# Patient Record
Sex: Female | Born: 1960 | Race: White | Hispanic: No | Marital: Married | Smoking: Former smoker
Health system: Southern US, Community
[De-identification: ages and names within clinical notes are randomized; demographics above are authoritative.]

## PROBLEM LIST (undated history)

## (undated) DIAGNOSIS — J449 Chronic obstructive pulmonary disease, unspecified: Secondary | ICD-10-CM

## (undated) DIAGNOSIS — J45909 Unspecified asthma, uncomplicated: Secondary | ICD-10-CM

## (undated) DIAGNOSIS — F329 Major depressive disorder, single episode, unspecified: Secondary | ICD-10-CM

## (undated) DIAGNOSIS — F419 Anxiety disorder, unspecified: Secondary | ICD-10-CM

## (undated) DIAGNOSIS — R0600 Dyspnea, unspecified: Secondary | ICD-10-CM

## (undated) DIAGNOSIS — E039 Hypothyroidism, unspecified: Secondary | ICD-10-CM

## (undated) DIAGNOSIS — F32A Depression, unspecified: Secondary | ICD-10-CM

## (undated) DIAGNOSIS — K219 Gastro-esophageal reflux disease without esophagitis: Secondary | ICD-10-CM

## (undated) DIAGNOSIS — M797 Fibromyalgia: Secondary | ICD-10-CM

## (undated) DIAGNOSIS — E669 Obesity, unspecified: Secondary | ICD-10-CM

## (undated) DIAGNOSIS — R918 Other nonspecific abnormal finding of lung field: Secondary | ICD-10-CM

## (undated) DIAGNOSIS — E785 Hyperlipidemia, unspecified: Secondary | ICD-10-CM

## (undated) HISTORY — DX: Other nonspecific abnormal finding of lung field: R91.8

## (undated) HISTORY — DX: Unspecified asthma, uncomplicated: J45.909

## (undated) HISTORY — DX: Fibromyalgia: M79.7

## (undated) HISTORY — DX: Depression, unspecified: F32.A

## (undated) HISTORY — DX: Hypothyroidism, unspecified: E03.9

## (undated) HISTORY — DX: Dyspnea, unspecified: R06.00

## (undated) HISTORY — DX: Gastro-esophageal reflux disease without esophagitis: K21.9

## (undated) HISTORY — DX: Anxiety disorder, unspecified: F41.9

## (undated) HISTORY — DX: Obesity, unspecified: E66.9

## (undated) HISTORY — DX: Hyperlipidemia, unspecified: E78.5

## (undated) HISTORY — DX: Major depressive disorder, single episode, unspecified: F32.9

## (undated) HISTORY — PX: GASTRIC BYPASS: SHX52

---

## 1989-09-07 HISTORY — PX: TUBAL LIGATION: SHX77

## 2011-05-09 LAB — HM MAMMOGRAPHY

## 2011-10-09 LAB — HM PAP SMEAR

## 2012-12-13 ENCOUNTER — Ambulatory Visit (INDEPENDENT_AMBULATORY_CARE_PROVIDER_SITE_OTHER): Payer: Medicare Other | Admitting: Family Medicine

## 2012-12-13 ENCOUNTER — Encounter: Payer: Self-pay | Admitting: Family Medicine

## 2012-12-13 VITALS — BP 130/89 | HR 74 | Ht 64.0 in | Wt 198.0 lb

## 2012-12-13 DIAGNOSIS — Z5181 Encounter for therapeutic drug level monitoring: Secondary | ICD-10-CM

## 2012-12-13 DIAGNOSIS — F329 Major depressive disorder, single episode, unspecified: Secondary | ICD-10-CM

## 2012-12-13 DIAGNOSIS — M79609 Pain in unspecified limb: Secondary | ICD-10-CM

## 2012-12-13 DIAGNOSIS — E039 Hypothyroidism, unspecified: Secondary | ICD-10-CM

## 2012-12-13 DIAGNOSIS — E559 Vitamin D deficiency, unspecified: Secondary | ICD-10-CM

## 2012-12-13 DIAGNOSIS — E785 Hyperlipidemia, unspecified: Secondary | ICD-10-CM

## 2012-12-13 DIAGNOSIS — F3289 Other specified depressive episodes: Secondary | ICD-10-CM

## 2012-12-13 DIAGNOSIS — F32A Depression, unspecified: Secondary | ICD-10-CM

## 2012-12-13 MED ORDER — NORTRIPTYLINE HCL 25 MG PO CAPS: 25.0000 mg | ORAL_CAPSULE | Freq: Every day | ORAL | Status: DC

## 2012-12-13 MED ORDER — DULOXETINE HCL 30 MG PO CPEP: ORAL_CAPSULE | ORAL | Status: DC

## 2012-12-13 MED ORDER — LEVOTHYROXINE SODIUM 125 MCG PO TABS: 125.0000 ug | ORAL_TABLET | Freq: Every day | ORAL | Status: DC

## 2012-12-13 MED ORDER — SIMVASTATIN 40 MG PO TABS: 40.0000 mg | ORAL_TABLET | Freq: Every evening | ORAL | Status: DC

## 2012-12-13 MED ORDER — ERGOCALCIFEROL 1.25 MG (50000 UT) PO CAPS: ORAL_CAPSULE | ORAL | Status: DC

## 2012-12-13 NOTE — Progress Notes (Signed)
Subjective:     Patient ID: Brandy Hood, female   DOB: Feb 08, 1961, 52 y.o.   MRN: [id number]  HPI Brandy Hood is here today to get some of her medications refilled.  She has done well since her last office visit.   Review of Systems  Constitutional: Negative for unexpected weight change.  Respiratory: Negative for shortness of breath.   Cardiovascular: Negative for chest pain.       Objective:   Physical Exam  Constitutional: She appears well-nourished. No distress.  Cardiovascular: Normal rate, regular rhythm and normal heart sounds.   Pulmonary/Chest: Effort normal and breath sounds normal.  Musculoskeletal: She exhibits no edema.       Assessment:   Hyperlipidemia Hypothyroidism  Fibromyalgia Vitamin D Defiency     Plan:    She was given refills for her medications and is to return in June for a recheck of her labs.

## 2012-12-14 ENCOUNTER — Other Ambulatory Visit: Payer: Medicare Other

## 2012-12-17 DIAGNOSIS — F329 Major depressive disorder, single episode, unspecified: Secondary | ICD-10-CM | POA: Insufficient documentation

## 2012-12-17 DIAGNOSIS — E785 Hyperlipidemia, unspecified: Secondary | ICD-10-CM | POA: Insufficient documentation

## 2012-12-17 DIAGNOSIS — M79609 Pain in unspecified limb: Secondary | ICD-10-CM | POA: Insufficient documentation

## 2012-12-17 DIAGNOSIS — E039 Hypothyroidism, unspecified: Secondary | ICD-10-CM | POA: Insufficient documentation

## 2012-12-17 DIAGNOSIS — E559 Vitamin D deficiency, unspecified: Secondary | ICD-10-CM | POA: Insufficient documentation

## 2012-12-17 DIAGNOSIS — F32A Depression, unspecified: Secondary | ICD-10-CM | POA: Insufficient documentation

## 2013-01-25 ENCOUNTER — Encounter: Payer: Self-pay | Admitting: Family Medicine

## 2013-01-25 ENCOUNTER — Ambulatory Visit (INDEPENDENT_AMBULATORY_CARE_PROVIDER_SITE_OTHER): Payer: Medicare Other | Admitting: Family Medicine

## 2013-01-25 ENCOUNTER — Ambulatory Visit (HOSPITAL_BASED_OUTPATIENT_CLINIC_OR_DEPARTMENT_OTHER)
Admission: RE | Admit: 2013-01-25 | Discharge: 2013-01-25 | Disposition: A | Discharge status: Home / Self Care | Payer: Medicare Other | Source: Ambulatory Visit | Attending: Family Medicine | Admitting: Family Medicine

## 2013-01-25 VITALS — BP 113/78 | HR 79 | Wt 200.0 lb

## 2013-01-25 DIAGNOSIS — J45909 Unspecified asthma, uncomplicated: Secondary | ICD-10-CM | POA: Insufficient documentation

## 2013-01-25 DIAGNOSIS — Z9884 Bariatric surgery status: Secondary | ICD-10-CM | POA: Insufficient documentation

## 2013-01-25 DIAGNOSIS — IMO0001 Reserved for inherently not codable concepts without codable children: Secondary | ICD-10-CM | POA: Insufficient documentation

## 2013-01-25 DIAGNOSIS — M549 Dorsalgia, unspecified: Secondary | ICD-10-CM

## 2013-01-25 DIAGNOSIS — M546 Pain in thoracic spine: Secondary | ICD-10-CM | POA: Insufficient documentation

## 2013-01-25 MED ORDER — TIZANIDINE HCL 4 MG PO TABS: 4.0000 mg | ORAL_TABLET | Freq: Three times a day (TID) | ORAL | Status: DC

## 2013-01-25 MED ORDER — METHYLPREDNISOLONE SODIUM SUCC 125 MG IJ SOLR
125.0000 mg | Freq: Once | INTRAMUSCULAR | Status: AC
  Administered 2013-01-25: 125 mg via INTRAMUSCULAR

## 2013-01-25 MED ORDER — NABUMETONE 750 MG PO TABS: 750.0000 mg | ORAL_TABLET | Freq: Two times a day (BID) | ORAL | Status: AC

## 2013-01-25 MED ORDER — KETOROLAC TROMETHAMINE 60 MG/2ML IM SOLN
60.0000 mg | Freq: Once | INTRAMUSCULAR | Status: AC
  Administered 2013-01-25: 60 mg via INTRAMUSCULAR

## 2013-01-25 NOTE — Patient Instructions (Addendum)
Thoracic Strain  You have injured the muscles or tendons that attach to the upper part of your back behind your chest. This injury is called a thoracic strain, thoracic sprain, or mid-back strain.   CAUSES   The cause of thoracic strain varies. A less severe injury involves pulling a muscle or tendon without tearing it. A more severe injury involves tearing (rupturing) a muscle or tendon. With less severe injuries, there may be little loss of strength. Sometimes, there are breaks (fractures) in the bones to which the muscles are attached. These fractures are rare, unless there was a direct hit (trauma) or you have weak bones due to osteoporosis or age. Longstanding strains may be caused by overuse or improper form during certain movements. Obesity can also increase your risk for back injuries. Sudden strains may occur due to injury or not warming up properly before exercise. Often, there is no obvious cause for a thoracic strain.  SYMPTOMS   The main symptom is pain, especially with movement, such as during exercise.  DIAGNOSIS   Your caregiver can usually tell what is wrong by taking an X-ray and doing a physical exam.  TREATMENT    Physical therapy may be helpful for recovery. Your caregiver can give you exercises to do or refer you to a physical therapist after your pain improves.   After your pain improves, strengthening and conditioning programs appropriate for your sport or occupation may be helpful.   Always warm up before physical activities or athletics. Stretching after physical activity may also help.   Certain over-the-counter medicines may also help. Ask your caregiver if there are medicines that would help you.  If this is your first thoracic strain injury, proper care and proper healing time before starting activities should prevent long-term problems. Torn ligaments and tendons require as long to heal as broken bones. Average healing times may be only 1 week for a mild strain. For torn muscles  and tendons, healing time may be up to 6 weeks to 2 months.  HOME CARE INSTRUCTIONS    Apply ice to the injured area. Ice massages may also be used as directed.   Put ice in a plastic bag.   Place a towel between your skin and the bag.   Leave the ice on for 15-20 minutes, 3-4 times a day, for the first 2 days.   Only take over-the-counter or prescription medicines for pain, discomfort, or fever as directed by your caregiver.   Keep your appointments for physical therapy if this was prescribed.   Use wraps and back braces as instructed.  SEEK IMMEDIATE MEDICAL CARE IF:    You have an increase in bruising, swelling, or pain.   Your pain has not improved with medicines.   You develop new shortness of breath, chest pain, or fever.   Problems seem to be getting worse rather than better.  MAKE SURE YOU:    Understand these instructions.   Will watch your condition.   Will get help right away if you are not doing well or get worse.  Document Released: 11/14/2003 Document Revised: 11/16/2011 Document Reviewed: 10/10/2010  [employer] Patient Information 2014 [employer].

## 2013-01-25 NOTE — Progress Notes (Signed)
  Subjective:    Patient ID: Brandy Hood, female    DOB: 1961-05-28, 52 y.o.   MRN: [id number] Brandy Hood is here today with her husband Brandy Hood) complaining of mid-back pain.    Back Pain This is a new problem. The current episode started yesterday. The problem occurs constantly. The problem has been gradually worsening since onset. The pain is present in the thoracic spine. The quality of the pain is described as stabbing and aching. The pain does not radiate. The pain is at a severity of 9/10. The pain is severe. The pain is the same all the time. The symptoms are aggravated by bending, coughing, standing and twisting. Stiffness is present all day. Pertinent negatives include no numbness, paresthesias or weakness. Risk factors include obesity, history of steroid use and menopause. She has tried analgesics and NSAIDs for the symptoms. The treatment provided no relief.   Review of Systems  Musculoskeletal: Positive for back pain (Located in her mid back ).  Neurological: Negative for weakness, numbness and paresthesias.   Past Medical History  Diagnosis Date  . Fibromyalgia   . Thyroid disease   . GERD (gastroesophageal reflux disease)   . Hyperlipidemia   . Anxiety   . Asthma     Seasonal  . Obesity     Maximun Weight 368    Family History  Problem Relation Age of Onset  . Diabetes Mother   . Hyperlipidemia Mother   . COPD Mother   . Asthma Mother   . Hypertension Father   . Hyperlipidemia Father   . Asthma Father    History   Social History Narrative   Marital Status: Married Brandy Hood)    Children:  Sons Brandy Hood and Brandy Hood)   Pets: Dog (1) Ferret (1) Rabbit (1)    Living Situation: Lives with her husband Brandy Hood, her son Brandy Hood and his wife and her adopted grandson Brandy Hood)   Occupation: Disabled (Fibromyalgia)    Education: Engineer, agricultural    Tobacco Use:  She quit smoking in 1992 after having smoked up to 4 ppd for about eight years.   Alcohol Use:  Occasional   Drug  Use:  None   Diet:  Regular   Exercise:  None   Hobbies: Collecting Bags and Shoes.                Objective:   Physical Exam  Constitutional: She appears distressed.  Neck: Normal range of motion. Neck supple.  Cardiovascular: Normal rate, regular rhythm and normal heart sounds.   Pulmonary/Chest: Effort normal and breath sounds normal.  Musculoskeletal:       Thoracic back: She exhibits tenderness and spasm. She exhibits normal range of motion, no edema and no deformity.  Neurological: She has normal reflexes. She exhibits normal muscle tone. Coordination normal.  Skin: No rash noted.       Assessment & Plan:

## 2013-02-08 DIAGNOSIS — M549 Dorsalgia, unspecified: Secondary | ICD-10-CM | POA: Insufficient documentation

## 2013-02-08 NOTE — Assessment & Plan Note (Signed)
She was sent for an x-ray which was normal.  She received injections of Solu-Medrol and Toradol and was given prescriptions for medications for back pain.

## 2013-04-05 ENCOUNTER — Other Ambulatory Visit: Payer: Self-pay | Admitting: *Deleted

## 2013-04-05 DIAGNOSIS — E039 Hypothyroidism, unspecified: Secondary | ICD-10-CM

## 2013-04-05 DIAGNOSIS — E785 Hyperlipidemia, unspecified: Secondary | ICD-10-CM

## 2013-04-05 DIAGNOSIS — Z5181 Encounter for therapeutic drug level monitoring: Secondary | ICD-10-CM

## 2013-04-06 ENCOUNTER — Other Ambulatory Visit: Payer: Medicare Other

## 2013-04-06 LAB — CBC WITH DIFFERENTIAL/PLATELET
Basophils Absolute: 0 10*3/uL (ref 0.0–0.1)
Basophils Relative: 1 % (ref 0–1)
Eosinophils Absolute: 0.1 10*3/uL (ref 0.0–0.7)
Eosinophils Relative: 1 % (ref 0–5)
HCT: 40.1 % (ref 36.0–46.0)
Hemoglobin: 13.6 g/dL (ref 12.0–15.0)
Lymphocytes Relative: 37 % (ref 12–46)
Lymphs Abs: 2.2 10*3/uL (ref 0.7–4.0)
MCH: 30.4 pg (ref 26.0–34.0)
MCHC: 33.9 g/dL (ref 30.0–36.0)
MCV: 89.7 fL (ref 78.0–100.0)
Monocytes Absolute: 0.5 10*3/uL (ref 0.1–1.0)
Monocytes Relative: 8 % (ref 3–12)
Neutro Abs: 3.2 10*3/uL (ref 1.7–7.7)
Neutrophils Relative %: 53 % (ref 43–77)
Platelets: 278 10*3/uL (ref 150–400)
RBC: 4.47 MIL/uL (ref 3.87–5.11)
RDW: 13.6 % (ref 11.5–15.5)
WBC: 5.9 10*3/uL (ref 4.0–10.5)

## 2013-04-06 LAB — LIPID PANEL
Cholesterol: 172 mg/dL (ref 0–200)
HDL: 65 mg/dL (ref >39)
LDL Cholesterol: 94 mg/dL (ref 0–99)
Total CHOL/HDL Ratio: 2.6 Ratio
Triglycerides: 67 mg/dL (ref <150)
VLDL: 13 mg/dL (ref 0–40)

## 2013-04-06 LAB — COMPLETE METABOLIC PANEL WITH GFR
ALT: 18 U/L (ref 0–35)
AST: 17 U/L (ref 0–37)
Albumin: 4.1 g/dL (ref 3.5–5.2)
Alkaline Phosphatase: 122 U/L — ABNORMAL HIGH (ref 39–117)
BUN: 7 mg/dL (ref 6–23)
CO2: 30 mEq/L (ref 19–32)
Calcium: 9.4 mg/dL (ref 8.4–10.5)
Chloride: 105 mEq/L (ref 96–112)
Creat: 0.73 mg/dL (ref 0.50–1.10)
GFR, Est African American: >89 mL/min
GFR, Est Non African American: >89 mL/min
Glucose, Bld: 97 mg/dL (ref 70–99)
Potassium: 4.6 mEq/L (ref 3.5–5.3)
Sodium: 141 mEq/L (ref 135–145)
Total Bilirubin: 0.4 mg/dL (ref 0.3–1.2)
Total Protein: 6.3 g/dL (ref 6.0–8.3)

## 2013-04-06 LAB — T4, FREE: Free T4: 1.5 ng/dL (ref 0.80–1.80)

## 2013-04-06 LAB — TSH: TSH: 0.013 u[IU]/mL — ABNORMAL LOW (ref 0.350–4.500)

## 2013-04-19 ENCOUNTER — Ambulatory Visit (INDEPENDENT_AMBULATORY_CARE_PROVIDER_SITE_OTHER): Payer: Medicare Other | Admitting: Family Medicine

## 2013-04-19 ENCOUNTER — Encounter: Payer: Self-pay | Admitting: Family Medicine

## 2013-04-19 VITALS — BP 132/85 | HR 77 | Wt 196.0 lb

## 2013-04-19 DIAGNOSIS — F329 Major depressive disorder, single episode, unspecified: Secondary | ICD-10-CM

## 2013-04-19 DIAGNOSIS — M549 Dorsalgia, unspecified: Secondary | ICD-10-CM

## 2013-04-19 DIAGNOSIS — E785 Hyperlipidemia, unspecified: Secondary | ICD-10-CM

## 2013-04-19 DIAGNOSIS — F3289 Other specified depressive episodes: Secondary | ICD-10-CM

## 2013-04-19 DIAGNOSIS — J45909 Unspecified asthma, uncomplicated: Secondary | ICD-10-CM | POA: Insufficient documentation

## 2013-04-19 DIAGNOSIS — F32A Depression, unspecified: Secondary | ICD-10-CM

## 2013-04-19 DIAGNOSIS — K219 Gastro-esophageal reflux disease without esophagitis: Secondary | ICD-10-CM | POA: Insufficient documentation

## 2013-04-19 DIAGNOSIS — J4489 Other specified chronic obstructive pulmonary disease: Secondary | ICD-10-CM | POA: Insufficient documentation

## 2013-04-19 DIAGNOSIS — J449 Chronic obstructive pulmonary disease, unspecified: Secondary | ICD-10-CM | POA: Insufficient documentation

## 2013-04-19 DIAGNOSIS — M79609 Pain in unspecified limb: Secondary | ICD-10-CM

## 2013-04-19 MED ORDER — IPRATROPIUM-ALBUTEROL 18-103 MCG/ACT IN AERO: 1.0000 | INHALATION_SPRAY | Freq: Four times a day (QID) | RESPIRATORY_TRACT | Status: AC | PRN

## 2013-04-19 MED ORDER — FAMOTIDINE 40 MG PO TABS: 40.0000 mg | ORAL_TABLET | Freq: Every day | ORAL | Status: DC

## 2013-04-19 MED ORDER — PANTOPRAZOLE SODIUM 40 MG PO TBEC: 40.0000 mg | DELAYED_RELEASE_TABLET | Freq: Every day | ORAL | Status: DC

## 2013-04-19 MED ORDER — DULOXETINE HCL 30 MG PO CPEP: ORAL_CAPSULE | ORAL | Status: DC

## 2013-04-19 MED ORDER — MONTELUKAST SODIUM 10 MG PO TABS: 10.0000 mg | ORAL_TABLET | Freq: Every day | ORAL | Status: DC

## 2013-04-19 MED ORDER — FLUTICASONE FUROATE-VILANTEROL 100-25 MCG/INH IN AEPB: 1.0000 | INHALATION_SPRAY | Freq: Every day | RESPIRATORY_TRACT | Status: AC

## 2013-04-19 MED ORDER — NORTRIPTYLINE HCL 25 MG PO CAPS: 25.0000 mg | ORAL_CAPSULE | Freq: Every day | ORAL | Status: DC

## 2013-04-19 NOTE — Assessment & Plan Note (Signed)
She is going to follow up with a plastic surgeon to see if she can get breast reduction surgery to help her back pain.

## 2013-04-19 NOTE — Patient Instructions (Addendum)
1)  COPD/Asthma - Stop the Asmanex and try a combination of the Singulair, Breo and Combivent.    Chronic Obstructive Pulmonary Disease Chronic obstructive pulmonary disease (COPD) is a condition in which airflow from the lungs is restricted. The lungs can never return to normal, but there are measures you can take which will improve them and make you feel better. CAUSES   Smoking.  Exposure to secondhand smoke.  Breathing in irritants such as air pollution, dust, cigarette smoke, strong odors, aerosol sprays, or paint fumes.  History of lung infections. SYMPTOMS   Deep, persistent (chronic) cough with a large amount of thick mucus.  Wheezing.  Shortness of breath, especially with physical activity.  Feeling like you cannot get enough air.  Difficulty breathing.  Rapid breaths (tachypnea).  Gray or bluish discoloration (cyanosis) of the skin, especially in fingers, toes, or lips.  Fatigue.  Weight loss.  Swelling in legs, ankles, or feet.  Fast heartbeat (tachycardia).  Frequent lung infections.   Chest tightness. DIAGNOSIS  Initial diagnosis may be based on your history, symptoms, and physical examination. Additional tests for COPD may include:  Chest X-ray.  Computed tomography (CT) scan.  Lung (pulmonary) function tests.  Blood tests. TREATMENT  Treatment focuses on making you comfortable (supportive care). Your caregiver may prescribe medicines (inhaled or pills) to help improve your breathing. Additional treatment options may include oxygen therapy and pulmonary rehabilitation. Treatment should also include reducing your exposure to known irritants and following a plan to stop smoking. HOME CARE INSTRUCTIONS   Take all medicines, including antibiotic medicines, as directed by your caregiver.  Use inhaled medicines as directed by your caregiver.  Avoid medicines or cough syrups that dry up your airway (antihistamines) and slow down the elimination of  secretions. This decreases respiratory capacity and may lead to infections.  If you smoke, stop smoking.  Avoid exposure to smoke, chemicals, and fumes that aggravate your breathing.  Avoid contact with individuals that have a contagious illness.  Avoid extreme temperature and humidity changes.  Use humidifiers at home and at your bedside if they do not make breathing difficult.  Drink enough water and fluids to keep your urine clear or pale yellow. This loosens secretions.  Eat healthy foods. Eating smaller, more frequent meals and resting before meals may help you maintain your strength.  Ask your caregiver about the use of vitamins and mineral supplements.  Stay active. Exercise and physical activity will help maintain your ability to do things you want to do.  Balance activity with periods of rest.  Assume a position of comfort if you become short of breath.  Learn and use relaxation techniques.  Learn and use controlled breathing techniques as directed by your caregiver. Controlled breathing techniques include:  Pursed lip breathing. This breathing technique starts with breathing in (inhaling) through your nose for 1 second. Next, purse your lips as if you were going to whistle. Then breathe out (exhale) through the pursed lips for 2 seconds.  Diaphragmatic breathing. Start by putting one hand on your abdomen just above your waist. Inhale slowly through your nose. The hand on your abdomen should move out. Then exhale slowly through pursed lips. You should be able to feel the hand on your abdomen moving in as you exhale.  Learn and use controlled coughing to clear mucus from your lungs. Controlled coughing is a series of short, progressive coughs. The steps of controlled coughing are: 1. Lean your head slightly forward. 2. Breathe in deeply using  diaphragmatic breathing. 3. Try to hold your breath for 3 seconds. 4. Keep your mouth slightly open while coughing twice. 5. Spit  any mucus out into a tissue. 6. Rest and repeat the steps once or twice as needed.  Receive all protective vaccines your caregiver suggests, especially pneumococcal and influenza vaccines.  Learn to manage stress.  Schedule and attend all follow-up appointments as directed by your caregiver. It is important to keep all your appointments.  Participate in pulmonary rehabilitation as directed by your caregiver.  Use home oxygen as suggested. SEEK MEDICAL CARE IF:   You are coughing up more mucus than usual.  There is a change in the color or thickness of the mucus.  Breathing is more labored than usual.  Your breathing is faster than usual.  Your skin color is more cyanotic than usual.  You are running out of the medicine you take for your breathing.  You are anxious, apprehensive, or restless.  You have a fever. SEEK IMMEDIATE MEDICAL CARE IF:   You have a rapid heart rate.  You have shortness of breath while you are resting.  You have shortness of breath that prevents you from being able to talk.  You have shortness of breath that prevents you from performing your usual physical activities.  You have chest pain lasting longer than 5 minutes.  You have a seizure.  Your family or friends notice that you are agitated or confused. MAKE SURE YOU:   Understand these instructions.  Will watch your condition.  Will get help right away if you are not doing well or get worse. Document Released: 06/03/2005 Document Revised: 05/18/2012 Document Reviewed: 10/24/2010 [facility] Patient Information 2014 [place], [place].

## 2013-04-19 NOTE — Assessment & Plan Note (Signed)
Refilled her Cymbalta. 

## 2013-04-19 NOTE — Progress Notes (Signed)
Subjective:    Patient ID: Brandy Hood, female    DOB: 1961-01-30, 52 y.o.   MRN: [id number]  HPI  Brandy Hood is here today with her husband Jeannett Senior) to go over her most recent lab results and to discuss the conditions listed below:    1)  Asthma:  She has had this problem for most of her life.  She has had to use Albuterol daily for many years.  We started her on an Asmanex inhaler several months ago which has not really helped her at all.  She continues to use her Singulair daily.    2)  Hypothyroidism:  She is doing fine on her current dosage of levothyroxine.    3)  Leg Pain: She takes nortriptyline daily which has helped her.  She needs a refill on it.    4) Back Pain:  She has been having mid back pain.  She feels that her breasts are contributing to this pain. She is interested in having breast reduction.    5)  Depression:  Her mood is better on 90 mg of Cymbalta.    6)  GERD:  Her symptoms are controlled on the combination of Prevacid and Pepcid.     Review of Systems  Constitutional: Positive for fatigue. Negative for unexpected weight change (She has lost weight but has been working on her diet.  ).  HENT: Negative.   Eyes: Negative.   Respiratory: Positive for cough, shortness of breath and wheezing.   Cardiovascular: Negative.   Gastrointestinal: Negative.   Endocrine: Negative.   Genitourinary: Negative.   Musculoskeletal: Positive for back pain.       Mid-back  Neurological: Negative.   Hematological: Negative.   Psychiatric/Behavioral: Negative.    Past Medical History  Diagnosis Date  . Fibromyalgia   . Thyroid disease   . GERD (gastroesophageal reflux disease)   . Hyperlipidemia   . Anxiety   . Asthma     Seasonal  . Obesity     Maximun Weight 368    Family History  Problem Relation Age of Onset  . Diabetes Mother   . Hyperlipidemia Mother   . COPD Mother   . Asthma Mother   . Hypertension Father   . Hyperlipidemia Father   . Asthma Father     History   Social History Narrative   Marital Status: Married Jeannett Senior)    Children:  Sons Jeannett Senior and Jonny Ruiz)   Pets: Dog (1) Ferret (1) Rabbit (1)    Living Situation: Lives with her husband Jeannett Senior, her son Jeannett Senior and his wife and her adopted grandson Psychiatric nurse)   Occupation: Disabled (Fibromyalgia)    Education: Engineer, agricultural    Tobacco Use:  She quit smoking in 1992 after having smoked up to 4 ppd for about eight years.   Alcohol Use:  Occasional   Drug Use:  None   Diet:  Regular   Exercise:  None   Hobbies: Collecting Bags and Shoes.                 Objective:   Physical Exam  Constitutional: She appears well-nourished. No distress.  HENT:  Head: Normocephalic.  Eyes: No scleral icterus.  Neck: No thyromegaly present.  Cardiovascular: Normal rate, regular rhythm and normal heart sounds.   Pulmonary/Chest: Effort normal and breath sounds normal.  Abdominal: There is no tenderness.  Musculoskeletal: She exhibits no edema and no tenderness.  Neurological: She is alert.  Skin: Skin is warm and dry.  Psychiatric:  She has a normal mood and affect. Her behavior is normal. Judgment and thought content normal.          Assessment & Plan:

## 2013-04-19 NOTE — Assessment & Plan Note (Addendum)
Her leg pain has improved so she will remain on nortriptyline.

## 2013-04-19 NOTE — Assessment & Plan Note (Signed)
Her lipid panel is perfect on Zocor 40 mg so she will remain on this dosage.

## 2013-04-19 NOTE — Assessment & Plan Note (Signed)
She is using her albuterol daily.  We'll let her try Combivent to see how this works for her.

## 2013-04-19 NOTE — Assessment & Plan Note (Signed)
Refilled her Protonix and Pepcid.

## 2013-04-19 NOTE — Assessment & Plan Note (Signed)
She has a significant history of smoking and most likely has COPD so we'll let her try some Breo to see if this will help her symptoms.

## 2013-05-09 ENCOUNTER — Other Ambulatory Visit: Payer: Self-pay | Admitting: Family Medicine

## 2013-05-09 ENCOUNTER — Ambulatory Visit (INDEPENDENT_AMBULATORY_CARE_PROVIDER_SITE_OTHER): Payer: Medicare Other | Admitting: *Deleted

## 2013-05-09 DIAGNOSIS — Z23 Encounter for immunization: Secondary | ICD-10-CM

## 2013-05-10 LAB — OTHER SOLSTAS TEST
Varicella IgG: 1134 Index — ABNORMAL HIGH (ref <135.00)
Varicella Zoster Ab IgM: 2.46 {ISR} — ABNORMAL HIGH (ref <0.91)

## 2013-05-18 ENCOUNTER — Other Ambulatory Visit: Payer: Self-pay | Admitting: Family Medicine

## 2013-05-18 DIAGNOSIS — Z1231 Encounter for screening mammogram for malignant neoplasm of breast: Secondary | ICD-10-CM

## 2013-05-24 ENCOUNTER — Ambulatory Visit (HOSPITAL_BASED_OUTPATIENT_CLINIC_OR_DEPARTMENT_OTHER)
Admission: RE | Admit: 2013-05-24 | Discharge: 2013-05-24 | Disposition: A | Discharge status: Home / Self Care | Payer: Medicare Other | Source: Ambulatory Visit | Attending: Family Medicine | Admitting: Family Medicine

## 2013-05-24 DIAGNOSIS — Z1231 Encounter for screening mammogram for malignant neoplasm of breast: Secondary | ICD-10-CM

## 2013-05-30 ENCOUNTER — Ambulatory Visit (INDEPENDENT_AMBULATORY_CARE_PROVIDER_SITE_OTHER): Payer: Medicare Other | Admitting: Family Medicine

## 2013-05-30 ENCOUNTER — Encounter: Payer: Self-pay | Admitting: Family Medicine

## 2013-05-30 ENCOUNTER — Other Ambulatory Visit (HOSPITAL_COMMUNITY)
Admission: RE | Admit: 2013-05-30 | Discharge: 2013-05-30 | Disposition: A | Discharge status: Home / Self Care | Payer: Medicare Other | Source: Ambulatory Visit | Attending: Family Medicine | Admitting: Family Medicine

## 2013-05-30 VITALS — BP 131/89 | HR 76 | Resp 16 | Ht 62.25 in | Wt 194.0 lb

## 2013-05-30 DIAGNOSIS — Z124 Encounter for screening for malignant neoplasm of cervix: Secondary | ICD-10-CM

## 2013-05-30 DIAGNOSIS — Z23 Encounter for immunization: Secondary | ICD-10-CM

## 2013-05-30 DIAGNOSIS — R8290 Unspecified abnormal findings in urine: Secondary | ICD-10-CM

## 2013-05-30 DIAGNOSIS — M549 Dorsalgia, unspecified: Secondary | ICD-10-CM

## 2013-05-30 DIAGNOSIS — Z2911 Encounter for prophylactic immunotherapy for respiratory syncytial virus (RSV): Secondary | ICD-10-CM

## 2013-05-30 DIAGNOSIS — Z0000 Encounter for general adult medical examination without abnormal findings: Secondary | ICD-10-CM

## 2013-05-30 DIAGNOSIS — Z1151 Encounter for screening for human papillomavirus (HPV): Secondary | ICD-10-CM | POA: Insufficient documentation

## 2013-05-30 DIAGNOSIS — Z01419 Encounter for gynecological examination (general) (routine) without abnormal findings: Secondary | ICD-10-CM | POA: Insufficient documentation

## 2013-05-30 DIAGNOSIS — N390 Urinary tract infection, site not specified: Secondary | ICD-10-CM

## 2013-05-30 DIAGNOSIS — R82998 Other abnormal findings in urine: Secondary | ICD-10-CM

## 2013-05-30 MED ORDER — LIDOCAINE 5 % EX PTCH: 3.0000 | MEDICATED_PATCH | CUTANEOUS | Status: DC

## 2013-05-30 NOTE — Progress Notes (Signed)
Subjective:    Patient ID: Brandy Hood, female    DOB: [birth year], 52 y.o.   MRN: [id number]  HPI  Brandy Hood is here today for her annual CPE with pap smear.  Overall she feels that her health is good, except for her back and neck pain.  She has recently met with a plastic surgeon (Dr. Wayland Denis) who needs a letter from her PCP.     Review of Systems  Constitutional: Negative.   HENT: Positive for neck pain.   Eyes: Negative.   Respiratory: Negative.   Cardiovascular: Negative.   Gastrointestinal: Negative.   Endocrine: Negative.   Genitourinary: Negative.   Musculoskeletal: Positive for back pain.       Upper back   Skin: Negative.   Allergic/Immunologic: Negative.   Neurological: Negative.   Hematological: Negative.   Psychiatric/Behavioral: Negative.      Past Medical History  Diagnosis Date  . Fibromyalgia   . Thyroid disease   . GERD (gastroesophageal reflux disease)   . Hyperlipidemia   . Anxiety   . Asthma     Seasonal  . Obesity     Maximun Weight 368      Family History  Problem Relation Age of Onset  . Diabetes Mother   . Hyperlipidemia Mother   . COPD Mother   . Asthma Mother   . Hypertension Father   . Hyperlipidemia Father   . Asthma Father   . Aneurysm Father     AAA, Groin    History   Social History Narrative   Marital Status: Married Brandy Hood)    Children:  Sons Brandy Hood and Brandy Hood)   Pets: Dog (1) Ferret (1) Rabbit (1)    Living Situation: Lives with her husband Brandy Hood, her son Brandy Hood and his wife and her adopted grandson Psychiatric nurse)   Occupation: Disabled (Fibromyalgia)    Education: Engineer, agricultural    Tobacco Use:  She quit smoking in 1992 after having smoked up to 4 ppd for about eight years.   Alcohol Use:  Occasional   Drug Use:  None   Diet:  Regular   Exercise:  None   Hobbies: Collecting Bags and Shoes.                Objective:   Physical Exam  Vitals reviewed. Constitutional: She is oriented to person, place,  and time. She appears well-developed and well-nourished.  HENT:  Head: Normocephalic and atraumatic.  Right Ear: External ear normal.  Left Ear: External ear normal.  Nose: Nose normal.  Mouth/Throat: Oropharynx is clear and moist.  Eyes: Conjunctivae and EOM are normal. Pupils are equal, round, and reactive to light.  Neck: Normal range of motion. No thyromegaly present.  Cardiovascular: Normal rate, regular rhythm, normal heart sounds and intact distal pulses.  Exam reveals no gallop and no friction rub.   No murmur heard. Pulmonary/Chest: Effort normal and breath sounds normal.   Right breast exhibits no inverted nipple, no mass, no nipple discharge, no skin change and no tenderness. Left breast exhibits no inverted nipple, no mass, no nipple discharge, no skin change and no tenderness. Breasts are symmetrical.  Abdominal: Soft. Bowel sounds are normal. Hernia confirmed negative in the right inguinal area and confirmed negative in the left inguinal area.  Genitourinary: Vagina normal and uterus normal. Pelvic exam was performed with patient supine. There is no rash, tenderness or lesion on the right labia. There is no rash, tenderness or lesion on the left labia. No vaginal discharge  found.  Musculoskeletal: Normal range of motion. She exhibits no edema and no tenderness.  Lymphadenopathy:    She has no cervical adenopathy.       Right: No inguinal adenopathy present.       Left: No inguinal adenopathy present.  Neurological: She is alert and oriented to person, place, and time. She has normal reflexes.  Skin: Skin is warm and dry.     Tattoos x 7   Psychiatric: She has a normal mood and affect. Her behavior is normal. Judgment and thought content normal.      Assessment & Plan:

## 2013-05-30 NOTE — Patient Instructions (Addendum)
Preventive Care for Adults, Female A healthy lifestyle and preventive care can promote health and wellness. Preventive health guidelines for women include the following key practices.  A routine yearly physical is a good way to check with your caregiver about your health and preventive screening. It is a chance to share any concerns and updates on your health, and to receive a thorough exam.  Visit your dentist for a routine exam and preventive care every 6 months. Brush your teeth twice a day and floss once a day. Good oral hygiene prevents tooth decay and gum disease.  The frequency of eye exams is based on your age, health, family medical history, use of contact lenses, and other factors. Follow your caregiver's recommendations for frequency of eye exams.  Eat a healthy diet. Foods like vegetables, fruits, whole grains, low-fat dairy products, and lean protein foods contain the nutrients you need without too many calories. Decrease your intake of fo                                                                                                                                                                                                                                                               ings you can do for your health. Most adults should get at least 150 minutes of moderate-intensity exercise (any activity that increases your heart rate and causes you to sweat) each week. In addition, most adults need muscle-strengthening exercises on 2 or more days a week.  Maintain a healthy weight. The body mass index (BMI) is a screening tool to identify possible weight problems. It provides an estimate of body fat based on height and weight. Your caregiver can help determine your BMI, and can help you achieve or maintain a healthy weight.For adults 20 years and older:  A BMI below 18.5 is considered underweight.  A BMI of 18.5 to 24.9 is normal.  A BMI of 25 to 29.9 is considered  overweight.  A BMI of 30 and above is considered obese.  Maintain normal blood lipids and cholesterol levels by exercising and minimizing your intake of saturated fat. Eat a balanced diet with plenty of fruit and vegetables. Blood tests for lipids and cholesterol should begin at age 80 and be repeated every 5 years. If your lipid  or cholesterol levels are high, you are over 50, or you are at high risk for heart disease, you may need your cholesterol levels checked more frequently.Ongoing high lipid and cholesterol levels should be treated with medicines if diet and exercise are not effective.  If you smoke, find out from your caregiver how to quit. If you do not use tobacco, do not start.  If you are pregnant, do not drink alcohol. If you are breastfeeding, be very cautious about drinking alcohol. If you are not pregnant and choose to drink alcohol, do not exceed 1 drink per day. One drink is considered to be 12 ounces (355 mL) of beer, 5 ounces (148 mL) of wine, or 1.5 ounces (44 mL) of liquor.  Avoid use of street drugs. Do not share needles with anyone. Ask for help if you need support or instructions about stopping the use of drugs.  High blood pressure causes heart disease and increases the risk of stroke. Your blood pressure should be checked at least every 1 to 2 years. Ongoing high blood pressure should be treated with medicines if weight loss and exercise are not effective.  If you are 87 to 52 years old, ask your caregiver if you should take aspirin to prevent strokes.  Diabetes screening involves taking a blood sample to check your fasting blood sugar level. This should be done once every 3 years, after age 6, if you are within normal weight and without risk factors for diabetes. Testing should be considered at a younger age or be carried out more frequently if you are overweight and have at least 1 risk factor for diabetes.  Breast cancer screening is essential preventive care for  women. You should practice "breast self-awareness." This means understanding the normal appearance and feel of your breasts and may include breast self-examination. Any changes detected, no matter how small, should be reported to a caregiver. Women in their 62s and 30s should have a clinical breast exam (CBE) by a caregiver as part of a regular health exam every 1 to 3 years. After age 38, women should have a CBE every year. Starting at age 60, women should consider having a mammography (breast X-ray test) every year. Women who have a family history of breast cancer should talk to their caregiver about genetic screening. Women at a high risk of breast cancer should talk to their caregivers about having magnetic resonance imaging (MRI) and a mammography every year.  The Pap test is a screening test for cervical cancer. A Pap test can show cell changes on the cervix that might become cervical cancer if left untreated. A Pap test is a procedure in which cells are obtained and examined from the lower end of the uterus (cervix).  Women should have a Pap test starting at age 63.  Between ages 49 and 54, Pap tests should be repeated every 2 years.  Beginning at age 39, you should have a Pap test every 3 years as long as the past 3 Pap tests have been normal.  Some women have medical problems that increase the chance of getting cervical cancer. Talk to your caregiver about these problems. It is especially important to talk to your caregiver if a new problem develops soon after your last Pap test. In these cases, your caregiver may recommend more frequent screening and Pap tests.  The above recommendations are the same for women who have or have not gotten the vaccine for human papillomavirus (HPV).  If you had a  hysterectomy for a problem that was not cancer or a condition that could lead to cancer, then you no longer need Pap tests. Even if you no longer need a Pap test, a regular exam is a good idea to make  sure no other problems are starting.  If you are between ages 74 and 63, and you have had normal Pap tests going back 10 years, you no longer need Pap tests. Even if you no longer need a Pap test, a regular exam is a good idea to make sure no other problems are starting.  If you have had past treatment for cervical cancer or a condition that could lead to cancer, you need Pap tests and screening for cancer for at least 20 years after your treatment.  If Pap tests have been discontinued, risk factors (such as a new sexual partner) need to be reassessed to determine if screening should be resumed.  The HPV test is an additional test that may be used for cervical cancer screening. The HPV test looks for the virus that can cause the cell changes on the cervix. The cells collected during the Pap test can be tested for HPV. The HPV test could be used to screen women aged 34 years and older, and should be used in women of any age who have unclear Pap test results. After the age of 70, women should have HPV testing at the same frequency as a Pap test.  Colorectal cancer can be detected and often prevented. Most routine colorectal cancer screening begins at the age of 62 and continues through age 3. However, your caregiver may recommend screening at an earlier age if you have risk factors for colon cancer. On a yearly basis, your caregiver may provide home test kits to check for hidden blood in the stool. Use of a small camera at the end of a tube, to directly examine the colon (sigmoidoscopy or colonoscopy), can detect the earliest forms of colorectal cancer. Talk to your caregiver about this at age 42, when routine screening begins. Direct examination of the colon should be repeated every 5 to 10 years through age 50, unless early forms of pre-cancerous polyps or small growths are found.  Hepatitis C blood testing is recommended for all people born from [birth year] through 1965 and any individual with known risks  for hepatitis C.  Practice safe sex. Use condoms and avoid high-risk sexual practices to reduce the spread of sexually transmitted infections (STIs). STIs include gonorrhea, chlamydia, syphilis, trichomonas, herpes, HPV, and human immunodeficiency virus (HIV). Herpes, HIV, and HPV are viral illnesses that have no cure. They can result in disability, cancer, and death. Sexually active women aged 16 and younger should be checked for chlamydia. Older women with new or multiple partners should also be tested for chlamydia. Testing for other STIs is recommended if you are sexually active and at increased risk.  Osteoporosis is a disease in which the bones lose minerals and strength with aging. This can result in serious bone fractures. The risk of osteoporosis can be identified using a bone density scan. Women ages 69 and over and women at risk for fractures or osteoporosis should discuss screening with their caregivers. Ask your caregiver whether you should take a calcium supplement or vitamin D to reduce the rate of osteoporosis.  Menopause can be associated with physical symptoms and risks. Hormone replacement therapy is available to decrease symptoms and risks. You should talk to your caregiver about whether hormone replacement therapy is right  for you.  Use sunscreen with sun protection factor (SPF) of 30 or more. Apply sunscreen liberally and repeatedly throughout the day. You should seek shade when your shadow is shorter than you. Protect yourself by wearing long sleeves, pants, a wide-brimmed hat, and sunglasses year round, whenever you are outdoors.  Once a month, do a whole body skin exam, using a mirror to look at the skin on your back. Notify your caregiver of new moles, moles that have irregular borders, moles that are larger than a pencil eraser, or moles that have changed in shape or color.  Stay current with required immunizations.  Influenza. You need a dose every fall (or winter). The  composition of the flu vaccine changes each year, so being vaccinated once is not enough.  Pneumococcal polysaccharide. You need 1 to 2 doses if you smoke cigarettes or if you have certain chronic medical conditions. You need 1 dose at age 65 (or older) if you have never been vaccinated.  Tetanus, diphtheria, pertussis (Tdap, Td). Get 1 dose of Tdap vaccine if you are younger than age 88, are over 23 and have contact with an infant, are a Research scientist (physical sciences), are pregnant, or simply want to be protected from whooping cough. After that, you need a Td booster dose every 10 years. Consult your caregiver if you have not had at least 3 tetanus and diphtheria-containing shots sometime in your life or have a deep or dirty wound.  HPV. You need this vaccine if you are a woman age 80 or younger. The vaccine is given in 3 doses over 6 months.  Measles, mumps, rubella (MMR). You need at least 1 dose of MMR if you were born in 1957 or later. You may also need a second dose.  Meningococcal. If you are age 75 to 84 and a first-year college student living in a residence hall, or have one of several medical conditions, you need to get vaccinated against meningococcal disease. You may also need additional booster doses.  Zoster (shingles). If you are age 35 or older, you should get this vaccine.  Varicella (chickenpox). If you have never had chickenpox or you were vaccinated but received only 1 dose, talk to your caregiver to find out if you need this vaccine.  Hepatitis A. You need this vaccine if you have a specific risk factor for hepatitis A virus infection or you simply wish to be protected from this disease. The vaccine is usually given as 2 doses, 6 to 18 months apart.  Hepatitis B. You need this vaccine if you have a specific risk factor for hepatitis B virus infection or you simply wish to be protected from this disease. The vaccine is given in 3 doses, usually over 6 months. Preventive Services /  Frequency Ages 40 to 48  Blood pressure check.** / Every 1 to 2 years.  Lipid and cholesterol check.** / Every 5 years beginning at age 59.  Clinical breast exam.** / Every 3 years for women in their 58s and 30s.  Pap test.** / Every 2 years from ages 69 through 80. Every 3 years starting at age 31 through age 50 or 53 with a history of 3 consecutive normal Pap tests.  HPV screening.** / Every 3 years from ages 84 through ages 30 to 28 with a history of 3 consecutive normal Pap tests.  Hepatitis C blood test.** / For any individual with known risks for hepatitis C.  Skin self-exam. / Monthly.  Influenza immunization.** / Every year.  Pneumococcal polysaccharide immunization.** / 1 to 2 doses if you smoke cigarettes or if you have certain chronic medical conditions.  Tetanus, diphtheria, pertussis (Tdap, Td) immunization. / A one-time dose of Tdap vaccine. After that, you need a Td booster dose every 10 years.  HPV immunization. / 3 doses over 6 months, if you are 3 and younger.  Measles, mumps, rubella (MMR) immunization. / You need at least 1 dose of MMR if you were born in 1957 or later. You may also need a second dose.  Meningococcal immunization. / 1 dose if you are age 34 to 20 and a first-year college student living in a residence hall, or have one of several medical conditions, you need to get vaccinated against meningococcal disease. You may also need additional booster doses.  Varicella immunization.** / Consult your caregiver.  Hepatitis A immunization.** / Consult your caregiver. 2 doses, 6 to 18 months apart.  Hepatitis B immunization.** / Consult your caregiver. 3 doses usually over 6 months. Ages 26 to 44  Blood pressure check.** / Every 1 to 2 years.  Lipid and cholesterol check.** / Every 5 years beginning at age 47.  Clinical breast exam.** / Every year after age 25.  Mammogram.** / Every year beginning at age 39 and continuing for as long as you are in  good health. Consult with your caregiver.  Pap test.** / Every 3 years starting at age 11 through age 73 or 37 with a history of 3 consecutive normal Pap tests.  HPV screening.** / Every 3 years from ages 28 through ages 76 to 61 with a history of 3 consecutive normal Pap tests.  Fecal occult blood test (FOBT) of stool. / Every year beginning at age 34 and continuing until age 97. You may not need to do this test if you get a colonoscopy every 10 years.  Flexible sigmoidoscopy or colonoscopy.** / Every 5 years for a flexible sigmoidoscopy or every 10 years for a colonoscopy beginning at age 18 and continuing until age 106.  Hepatitis C blood test.** / For all people born from [birth year] through 1965 and any individual with known risks for hepatitis C.  Skin self-exam. / Monthly.  Influenza immunization.** / Every year.  Pneumococcal polysaccharide immunization.** / 1 to 2 doses if you smoke cigarettes or if you have certain chronic medical conditions.  Tetanus, diphtheria, pertussis (Tdap, Td) immunization.** / A one-time dose of Tdap vaccine. After that, you need a Td booster dose every 10 years.  Measles, mumps, rubella (MMR) immunization. / You need at least 1 dose of MMR if you were born in 1957 or later. You may also need a second dose.  Varicella immunization.** / Consult your caregiver.  Meningococcal immunization.** / Consult your caregiver.  Hepatitis A immunization.** / Consult your caregiver. 2 doses, 6 to 18 months apart.  Hepatitis B immunization.** / Consult your caregiver. 3 doses, usually over 6 months. Ages 58 and over  Blood pressure check.** / Every 1 to 2 years.  Lipid and cholesterol check.** / Every 5 years beginning at age 69.  Clinical breast exam.** / Every year after age 57.  Mammogram.** / Every year beginning at age 51 and continuing for as long as you are in good health. Consult with your caregiver.  Pap test.** / Every 3 years starting at age 62 through  age 26 or 60 with a 3 consecutive normal Pap tests. Testing can be stopped between 65 and 70 with 3 consecutive normal Pap tests and  no abnormal Pap or HPV tests in the past 10 years.  HPV screening.** / Every 3 years from ages 52 through ages 52 or 33 with a history of 3 consecutive normal Pap tests. Testing can be stopped between 65 and 70 with 3 consecutive normal Pap tests and no abnormal Pap or HPV tests in the past 10 years.  Fecal occult blood test (FOBT) of stool. / Every year beginning at age 39 and continuing until age 33. You may not need to do this test if you get a colonoscopy every 10 years.  Flexible sigmoidoscopy or colonoscopy.** / Every 5 years for a flexible sigmoidoscopy or every 10 years for a colonoscopy beginning at age 1 and continuing until age 58.  Hepatitis C blood test.** / For all people born from [birth year] through 1965 and any individual with known risks for hepatitis C.  Osteoporosis screening.** / A one-time screening for women ages 51 and over and women at risk for fractures or osteoporosis.  Skin self-exam. / Monthly.  Influenza immunization.** / Every year.  Pneumococcal polysaccharide immunization.** / 1 dose at age 23 (or older) if you have never been vaccinated.  Tetanus, diphtheria, pertussis (Tdap, Td) immunization. / A one-time dose of Tdap vaccine if you are over 65 and have contact with an infant, are a Research scientist (physical sciences), or simply want to be protected from whooping cough. After that, you need a Td booster dose every 10 years.  Varicella immunization.** / Consult your caregiver.  Meningococcal immunization.** / Consult your caregiver.  Hepatitis A immunization.** / Consult your caregiver. 2 doses, 6 to 18 months apart.  Hepatitis B immunization.** / Check with your caregiver. 3 doses, usually over 6 months. ** Family history and personal history of risk and conditions may change your caregiver's recommendations. Document Released: 10/20/2001 Document  Revised: 11/16/2011 Document Reviewed: 01/19/2011 [facility] Patient Information 2014 [place], [place].

## 2013-06-01 ENCOUNTER — Telehealth: Payer: Self-pay | Admitting: *Deleted

## 2013-06-01 LAB — URINE CULTURE: Colony Count: >=100000

## 2013-06-01 MED ORDER — CIPROFLOXACIN HCL 250 MG PO TABS: 250.0000 mg | ORAL_TABLET | Freq: Two times a day (BID) | ORAL | Status: DC

## 2013-06-01 NOTE — Telephone Encounter (Signed)
Matika is aware that her urine was positive for E- coli. Cipro was called in for her. She will start it today- eh

## 2013-06-17 DIAGNOSIS — Z23 Encounter for immunization: Secondary | ICD-10-CM | POA: Insufficient documentation

## 2013-06-17 DIAGNOSIS — Z0000 Encounter for general adult medical examination without abnormal findings: Secondary | ICD-10-CM | POA: Insufficient documentation

## 2013-06-17 DIAGNOSIS — N390 Urinary tract infection, site not specified: Secondary | ICD-10-CM | POA: Insufficient documentation

## 2013-06-17 DIAGNOSIS — Z124 Encounter for screening for malignant neoplasm of cervix: Secondary | ICD-10-CM | POA: Insufficient documentation

## 2013-06-17 DIAGNOSIS — R8290 Unspecified abnormal findings in urine: Secondary | ICD-10-CM | POA: Insufficient documentation

## 2013-06-17 NOTE — Assessment & Plan Note (Signed)
Brandy Hood was not sure if she had chicken pox as a child so we checked a varicella titer which was positive.  She received a Zostavax at today's visit.

## 2013-06-17 NOTE — Assessment & Plan Note (Signed)
A pap was done without difficulty.  We're checking for high risk HPV with reflex to 16/18 if positive.   

## 2013-06-17 NOTE — Assessment & Plan Note (Addendum)
Brandy Hood has large breasts and would benefit from having a breast reduction. A letter of support was sent to Dr. Wayland Denis.  She was given a prescription for Lidoderm patches.

## 2013-06-17 NOTE — Assessment & Plan Note (Signed)
Her urine appeared to be infected so a culture was sent.

## 2013-06-17 NOTE — Assessment & Plan Note (Signed)
We discussed preventative issues for her age.  She is up to date on everything.

## 2013-06-17 NOTE — Assessment & Plan Note (Signed)
Her urine culture was positive. She was given a prescription for Cipro.

## 2013-11-08 ENCOUNTER — Other Ambulatory Visit: Payer: Self-pay | Admitting: Family Medicine

## 2013-11-09 ENCOUNTER — Other Ambulatory Visit: Payer: Self-pay | Admitting: *Deleted

## 2013-11-09 DIAGNOSIS — F329 Major depressive disorder, single episode, unspecified: Secondary | ICD-10-CM

## 2013-11-09 DIAGNOSIS — F32A Depression, unspecified: Secondary | ICD-10-CM

## 2013-11-09 MED ORDER — DULOXETINE HCL 30 MG PO CPEP: ORAL_CAPSULE | ORAL | Status: DC

## 2013-11-21 ENCOUNTER — Emergency Department (HOSPITAL_BASED_OUTPATIENT_CLINIC_OR_DEPARTMENT_OTHER): Payer: Medicare Other

## 2013-11-21 ENCOUNTER — Emergency Department (HOSPITAL_BASED_OUTPATIENT_CLINIC_OR_DEPARTMENT_OTHER)
Admission: EM | Admit: 2013-11-21 | Discharge: 2013-11-21 | Disposition: A | Discharge status: Home / Self Care | Payer: Medicare Other | Attending: Emergency Medicine | Admitting: Emergency Medicine

## 2013-11-21 ENCOUNTER — Encounter (HOSPITAL_BASED_OUTPATIENT_CLINIC_OR_DEPARTMENT_OTHER): Payer: Self-pay | Admitting: Emergency Medicine

## 2013-11-21 DIAGNOSIS — E785 Hyperlipidemia, unspecified: Secondary | ICD-10-CM | POA: Insufficient documentation

## 2013-11-21 DIAGNOSIS — Z8739 Personal history of other diseases of the musculoskeletal system and connective tissue: Secondary | ICD-10-CM | POA: Insufficient documentation

## 2013-11-21 DIAGNOSIS — Z9884 Bariatric surgery status: Secondary | ICD-10-CM | POA: Insufficient documentation

## 2013-11-21 DIAGNOSIS — F329 Major depressive disorder, single episode, unspecified: Secondary | ICD-10-CM | POA: Insufficient documentation

## 2013-11-21 DIAGNOSIS — Y9389 Activity, other specified: Secondary | ICD-10-CM | POA: Insufficient documentation

## 2013-11-21 DIAGNOSIS — Z87891 Personal history of nicotine dependence: Secondary | ICD-10-CM | POA: Insufficient documentation

## 2013-11-21 DIAGNOSIS — J45909 Unspecified asthma, uncomplicated: Secondary | ICD-10-CM | POA: Insufficient documentation

## 2013-11-21 DIAGNOSIS — F3289 Other specified depressive episodes: Secondary | ICD-10-CM | POA: Insufficient documentation

## 2013-11-21 DIAGNOSIS — K219 Gastro-esophageal reflux disease without esophagitis: Secondary | ICD-10-CM | POA: Insufficient documentation

## 2013-11-21 DIAGNOSIS — IMO0002 Reserved for concepts with insufficient information to code with codable children: Secondary | ICD-10-CM | POA: Insufficient documentation

## 2013-11-21 DIAGNOSIS — S43401A Unspecified sprain of right shoulder joint, initial encounter: Secondary | ICD-10-CM

## 2013-11-21 DIAGNOSIS — Y9229 Other specified public building as the place of occurrence of the external cause: Secondary | ICD-10-CM | POA: Insufficient documentation

## 2013-11-21 DIAGNOSIS — S20211A Contusion of right front wall of thorax, initial encounter: Secondary | ICD-10-CM

## 2013-11-21 DIAGNOSIS — E079 Disorder of thyroid, unspecified: Secondary | ICD-10-CM | POA: Insufficient documentation

## 2013-11-21 DIAGNOSIS — S20219A Contusion of unspecified front wall of thorax, initial encounter: Secondary | ICD-10-CM | POA: Insufficient documentation

## 2013-11-21 DIAGNOSIS — Z79899 Other long term (current) drug therapy: Secondary | ICD-10-CM | POA: Insufficient documentation

## 2013-11-21 NOTE — ED Provider Notes (Addendum)
CSN: [id number]     Arrival date & time 11/21/13  1445 History   First MD Initiated Contact with Patient 11/21/13 1552     Chief Complaint  Patient presents with  . Rib Injury     (Consider location/radiation/quality/duration/timing/severity/associated sxs/prior Treatment) HPI Comments: Pt was at the grocery store and child was falling out of a cart and she reached over her cart to catch her and hit her right ribs on the corner of the cart.  She also caught the child in there right arm and is having pain in the right shoulder.  Pain in the ribs is worse with movement, deep breathing, palpation and movement of the arm.  Denies SOB or cough.  Pt did not fall or hit her head.  Pain in the right shoulder with movement only.  The history is provided by the patient.    Past Medical History  Diagnosis Date  . Fibromyalgia   . Thyroid disease   . GERD (gastroesophageal reflux disease)   . Hyperlipidemia   . Anxiety   . Asthma     Seasonal  . Obesity     Maximun Weight 368    Past Surgical History  Procedure Laterality Date  . Gastric bypass    . Tubal ligation  1991   Family History  Problem Relation Age of Onset  . Diabetes Mother   . Hyperlipidemia Mother   . COPD Mother   . Asthma Mother   . Hypertension Father   . Hyperlipidemia Father   . Asthma Father   . Aneurysm Father     AAA, Groin   History  Substance Use Topics  . Smoking status: Former Smoker -- 1.00 packs/day for 35 years    Types: Cigarettes    Quit date: 09/07/1990  . Smokeless tobacco: Not on file     Comment: She smoked 4 ppd for 5 years and 1 ppd for 15 years   . Alcohol Use: No   OB History   Grav Para Term Preterm Abortions TAB SAB Ect Mult Living                 Review of Systems  All other systems reviewed and are negative.      Allergies  Wellbutrin  Home Medications   Current Outpatient Rx  Name  Route  Sig  Dispense  Refill  . albuterol-ipratropium (COMBIVENT) 18-103 MCG/ACT  inhaler   Inhalation   Inhale 1 puff into the lungs every 6 (six) hours as needed for wheezing or shortness of breath.   1 Inhaler   2   . ciprofloxacin (CIPRO) 250 MG tablet   Oral   Take 1 tablet (250 mg total) by mouth 2 (two) times daily.   6 tablet   0   . DULoxetine (CYMBALTA) 30 MG capsule      Take 3 capsules daily   90 capsule   0   . ergocalciferol (DRISDOL) 50000 UNITS capsule      Take 1 capsule po 3 times per week   12 capsule   12   . famotidine (PEPCID) 40 MG tablet   Oral   Take 1 tablet (40 mg total) by mouth daily.   90 tablet   3   . Fluticasone Furoate-Vilanterol (BREO ELLIPTA) 100-25 MCG/INH AEPB   Inhalation   Inhale 1 puff into the lungs daily.   1 each   11   . levothyroxine (SYNTHROID) 125 MCG tablet   Oral   Take  1 tablet (125 mcg total) by mouth daily.   90 tablet   3   . lidocaine (LIDODERM) 5 %   Transdermal   Place 3 patches onto the skin daily. Remove & Discard patch within 12 hours or as directed by MD   90 patch   5   . montelukast (SINGULAIR) 10 MG tablet   Oral   Take 1 tablet (10 mg total) by mouth at bedtime.   90 tablet   3   . nabumetone (RELAFEN) 750 MG tablet   Oral   Take 1 tablet (750 mg total) by mouth 2 (two) times daily.   60 tablet   2   . nortriptyline (PAMELOR) 25 MG capsule   Oral   Take 1 capsule (25 mg total) by mouth at bedtime.   90 capsule   1   . pantoprazole (PROTONIX) 40 MG tablet   Oral   Take 1 tablet (40 mg total) by mouth daily.   90 tablet   3   . simvastatin (ZOCOR) 40 MG tablet   Oral   Take 1 tablet (40 mg total) by mouth every evening.   90 tablet   3    BP 145/69  Pulse 84  Temp(Src) 98.4 F (36.9 C) (Oral)  Resp 18  Ht 5\' 2"  (1.575 m)  Wt 194 lb (87.998 kg)  BMI 35.47 kg/m2  SpO2 97% Physical Exam  Nursing note and vitals reviewed. Constitutional: She is oriented to person, place, and time. She appears well-developed and well-nourished. No distress.  HENT:    Head: Normocephalic and atraumatic.  Mouth/Throat: Oropharynx is clear and moist.  Eyes: Conjunctivae and EOM are normal. Pupils are equal, round, and reactive to light.  Neck: Normal range of motion. Neck supple.  Cardiovascular: Normal rate, regular rhythm and intact distal pulses.   No murmur heard. Pulmonary/Chest: Effort normal and breath sounds normal. No respiratory distress. She has no wheezes. She has no rales. She exhibits tenderness and bony tenderness. She exhibits no crepitus.    Abdominal: Soft. She exhibits no distension. There is no tenderness. There is no rebound and no guarding.  Musculoskeletal: Normal range of motion. She exhibits no edema.       Right shoulder: She exhibits tenderness. She exhibits normal range of motion, no bony tenderness, no deformity, normal pulse and normal strength.       Arms: Neurological: She is alert and oriented to person, place, and time.  Skin: Skin is warm and dry. No rash noted. No erythema.  Psychiatric: She has a normal mood and affect. Her behavior is normal.    ED Course  Procedures (including critical care time) Labs Review Labs Reviewed - No data to display Imaging Review Dg Ribs Unilateral W/chest Right  11/21/2013   CLINICAL DATA:  Larey SeatFell hitting right chest with rib pain  EXAM: RIGHT RIBS AND CHEST - 3+ VIEW  COMPARISON:  None.  FINDINGS: No active infiltrate or effusion is seen. No pneumothorax is noted. Mediastinal contours appear normal and the heart is within normal limits in size.  Right rib detail films show no acute right rib fracture  IMPRESSION: 1. No active lung disease. 2. Negative right rib detail.   Electronically Signed   By: Dwyane DeePaul  Barry M.D.   On: 11/21/2013 15:16     EKG Interpretation None      MDM   Final diagnoses:  Contusion of rib on right side  Sprain of right shoulder   3:58 PM Pt  here with rib pain and right shoulder pain after catching a child at the grocery store who was falling out of the  cart.  Hit her right ribs on the cart.  Denies SOB but pain in the right lower ribs.  Also pain with ROM of the right shoulder and in the [facility] joint.  CXR and rib imaging neg for fracture or PTX.  Low concern for acute fracture of the right shoulder.   Instructed to use ibuprofen and tylenol and ice/heat.  Pt does not wish to have anything stronger for pain at this time.   Gwyneth Sprout, MD 11/21/13 1603  Gwyneth Sprout, MD 11/21/13 [phone]

## 2013-11-21 NOTE — ED Notes (Signed)
Pain in her right ribs after bumping against a grocery cart.

## 2013-11-21 NOTE — Discharge Instructions (Signed)
Chest Contusion °A chest contusion is a deep bruise on your chest area. Contusions are the result of an injury that caused bleeding under the skin. A chest contusion may involve bruising of the skin, muscles, or ribs. The contusion may turn blue, purple, or yellow. Minor injuries will give you a painless contusion, but more severe contusions may stay painful and swollen for a few weeks. °CAUSES  °A contusion is usually caused by a blow, trauma, or direct force to an area of the body. °SYMPTOMS  °· Swelling and redness of the injured area. °· Discoloration of the injured area. °· Tenderness and soreness of the injured area. °· Pain. °DIAGNOSIS  °The diagnosis can be made by taking a history and performing a physical exam. An X-ray, CT scan, or MRI may be needed to determine if there were any associated injuries, such as broken bones (fractures) or internal injuries. °TREATMENT  °Often, the best treatment for a chest contusion is resting, icing, and applying cold compresses to the injured area. Deep breathing exercises may be recommended to reduce the risk of pneumonia. Over-the-counter medicines may also be recommended for pain control. °HOME CARE INSTRUCTIONS  °· Put ice on the injured area. °· Put ice in a plastic bag. °· Place a towel between your skin and the bag. °· Leave the ice on for 15-20 minutes, 03-04 times a day. °· Only take over-the-counter or prescription medicines as directed by your caregiver. Your caregiver may recommend avoiding anti-inflammatory medicines (aspirin, ibuprofen, and naproxen) for 48 hours because these medicines may increase bruising. °· Rest the injured area. °· Perform deep-breathing exercises as directed by your caregiver. °· Stop smoking if you smoke. °· Do not lift objects over 5 pounds (2.3 kg) for 3 days or longer if recommended by your caregiver. °SEEK IMMEDIATE MEDICAL CARE IF:  °· You have increased bruising or swelling. °· You have pain that is getting worse. °· You have  difficulty breathing. °· You have dizziness, weakness, or fainting. °· You have blood in your urine or stool. °· You cough up or vomit blood. °· Your swelling or pain is not relieved with medicines. °MAKE SURE YOU:  °· Understand these instructions. °· Will watch your condition. °· Will get help right away if you are not doing well or get worse. °Document Released: 05/19/2001 Document Revised: 05/18/2012 Document Reviewed: 02/15/2012 °[employer]® Patient Information ©2014 [employer]. ° °

## 2013-11-30 ENCOUNTER — Encounter: Payer: Self-pay | Admitting: Family Medicine

## 2013-11-30 ENCOUNTER — Ambulatory Visit (INDEPENDENT_AMBULATORY_CARE_PROVIDER_SITE_OTHER): Payer: Medicare Other | Admitting: Family Medicine

## 2013-11-30 VITALS — BP 127/81 | HR 78 | Resp 16 | Wt 194.0 lb

## 2013-11-30 DIAGNOSIS — E039 Hypothyroidism, unspecified: Secondary | ICD-10-CM

## 2013-11-30 DIAGNOSIS — E162 Hypoglycemia, unspecified: Secondary | ICD-10-CM

## 2013-11-30 DIAGNOSIS — K219 Gastro-esophageal reflux disease without esophagitis: Secondary | ICD-10-CM

## 2013-11-30 DIAGNOSIS — J45909 Unspecified asthma, uncomplicated: Secondary | ICD-10-CM

## 2013-11-30 DIAGNOSIS — M79609 Pain in unspecified limb: Secondary | ICD-10-CM

## 2013-11-30 DIAGNOSIS — F32A Depression, unspecified: Secondary | ICD-10-CM

## 2013-11-30 DIAGNOSIS — F3289 Other specified depressive episodes: Secondary | ICD-10-CM

## 2013-11-30 DIAGNOSIS — E785 Hyperlipidemia, unspecified: Secondary | ICD-10-CM

## 2013-11-30 DIAGNOSIS — F329 Major depressive disorder, single episode, unspecified: Secondary | ICD-10-CM

## 2013-11-30 MED ORDER — LEVOTHYROXINE SODIUM 125 MCG PO TABS: 125.0000 ug | ORAL_TABLET | Freq: Every day | ORAL | Status: DC

## 2013-11-30 MED ORDER — LEVOTHYROXINE SODIUM 125 MCG PO TABS: 125.0000 ug | ORAL_TABLET | Freq: Every day | ORAL | Status: AC

## 2013-11-30 MED ORDER — NORTRIPTYLINE HCL 25 MG PO CAPS: 25.0000 mg | ORAL_CAPSULE | Freq: Every day | ORAL | Status: AC

## 2013-11-30 MED ORDER — MONTELUKAST SODIUM 10 MG PO TABS: 10.0000 mg | ORAL_TABLET | Freq: Every day | ORAL | Status: AC

## 2013-11-30 MED ORDER — SIMVASTATIN 40 MG PO TABS: 40.0000 mg | ORAL_TABLET | Freq: Every evening | ORAL | Status: DC

## 2013-11-30 MED ORDER — PANTOPRAZOLE SODIUM 40 MG PO TBEC: 40.0000 mg | DELAYED_RELEASE_TABLET | Freq: Every day | ORAL | Status: AC

## 2013-11-30 MED ORDER — SIMVASTATIN 40 MG PO TABS
40.0000 mg | ORAL_TABLET | Freq: Every evening | ORAL | Status: DC
Start: 2013-11-30 — End: 2014-05-16

## 2013-11-30 MED ORDER — FAMOTIDINE 40 MG PO TABS: 40.0000 mg | ORAL_TABLET | Freq: Every day | ORAL | Status: AC

## 2013-11-30 MED ORDER — DULOXETINE HCL 30 MG PO CPEP
ORAL_CAPSULE | ORAL | Status: DC
Start: 2013-11-30 — End: 2014-05-16

## 2013-11-30 NOTE — Progress Notes (Signed)
Subjective:    Patient ID: Brandy Hood, female    DOB: 08/16/1961, 53 y.o.   MRN: [id number]  HPI  Brandy Hood is here today to get refills on several of her medications and to discuss the following condition.     1)  Hypoglycemia  - She had an episode of hypoglycemia about a month ago while she was at her pharmacy.  She says that she wasn't feeling good and lost consciousness for a few minutes.  EMT was called and her sugar was checked and was found to be low at 40 mg/dL.  She was instructed to see her PCP.  She has not had any other episodes.     Review of Systems  Constitutional: Negative for activity change, fatigue and unexpected weight change.  HENT: Negative.   Eyes: Negative.   Respiratory: Negative for shortness of breath.   Cardiovascular: Negative for chest pain, palpitations and leg swelling.  Gastrointestinal: Negative for diarrhea and constipation.  Endocrine: Negative.   Genitourinary: Negative for difficulty urinating.  Musculoskeletal: Negative.   Skin: Negative.   Neurological: Positive for syncope (This was due to low blood sugar.  ).  Hematological: Negative for adenopathy. Does not bruise/bleed easily.  Psychiatric/Behavioral: Negative for sleep disturbance and dysphoric mood. The patient is not nervous/anxious.      Past Medical History  Diagnosis Date  . Fibromyalgia   . Thyroid disease   . GERD (gastroesophageal reflux disease)   . Hyperlipidemia   . Anxiety   . Asthma     Seasonal  . Obesity     Maximun Weight 368   . COPD (chronic obstructive pulmonary disease)      Past Surgical History  Procedure Laterality Date  . Gastric bypass    . Tubal ligation  1991     History   Social History Narrative   Marital Status: Married Brandy Hood(Brandy Hood)    Children:  Sons Brandy Hood(Brandy Hood and Brandy Hood)   Pets: Dog (1) Ferret (1) Rabbit (1)    Living Situation: Lives with her husband Brandy Hood, her son Brandy Hood and his wife and her adopted grandson Psychiatric nurse(Brandy Hood)   Occupation:  Disabled (Fibromyalgia)    Education: Engineer, agriculturalHigh School Graduate    Tobacco Use:  She quit smoking in 1992 after having smoked up to 4 ppd for about eight years.   Alcohol Use:  Occasional   Drug Use:  None   Diet:  Regular   Exercise:  None   Hobbies: Collecting Bags and Shoes.               Family History  Problem Relation Age of Onset  . Diabetes Mother   . Hyperlipidemia Mother   . COPD Mother     Asbestos  . Asthma Mother   . Hypertension Father   . Hyperlipidemia Father   . Asthma Father   . Aneurysm Father     AAA, Groin  . Cancer Father     Lung  . COPD Father   . COPD Sister     Asbestos  . Asthma Sister      Current Outpatient Prescriptions on File Prior to Visit  Medication Sig Dispense Refill  . albuterol-ipratropium (COMBIVENT) 18-103 MCG/ACT inhaler Inhale 1 puff into the lungs every 6 (six) hours as needed for wheezing or shortness of breath.  1 Inhaler  2  . ergocalciferol (DRISDOL) 50000 UNITS capsule Take 1 capsule po 3 times per week  12 capsule  12  . Fluticasone Furoate-Vilanterol (BREO ELLIPTA) 100-25 MCG/INH  AEPB Inhale 1 puff into the lungs daily.  1 each  11  . lidocaine (LIDODERM) 5 % Place 3 patches onto the skin daily. Remove & Discard patch within 12 hours or as directed by MD  90 patch  5   No current facility-administered medications on file prior to visit.     Allergies  Allergen Reactions  . Wellbutrin [Bupropion] Hives     Immunization History  Administered Date(s) Administered  . Influenza,inj,Quad PF,36+ Mos 05/09/2013  . Zoster 05/30/2013       Objective:   Physical Exam  Constitutional: She appears well-nourished. No distress.  HENT:  Head: Normocephalic.  Eyes: No scleral icterus.  Neck: No thyromegaly present.  Cardiovascular: Normal rate, regular rhythm and normal heart sounds.   Pulmonary/Chest: Effort normal and breath sounds normal.  Abdominal: There is no tenderness.  Musculoskeletal: She exhibits no edema and  no tenderness.  Neurological: She is alert.  Skin: Skin is warm and dry.  Psychiatric: She has a normal mood and affect. Her behavior is normal. Judgment and thought content normal.      Assessment & Plan:    Ezell was seen today for medication management.  Diagnoses and associated orders for this visit:  Depression - DULoxetine (CYMBALTA) 30 MG capsule; Take 3 capsules daily  Unspecified hypothyroidism - levothyroxine (SYNTHROID) 125 MCG tablet; Take 1 tablet (125 mcg total) by mouth daily.  Unspecified asthma(493.90) - montelukast (SINGULAIR) 10 MG tablet; Take 1 tablet (10 mg total) by mouth at bedtime.  GERD (gastroesophageal reflux disease) - pantoprazole (PROTONIX) 40 MG tablet; Take 1 tablet (40 mg total) by mouth daily. - famotidine (PEPCID) 40 MG tablet; Take 1 tablet (40 mg total) by mouth daily.  Pain in limb - nortriptyline (PAMELOR) 25 MG capsule; Take 1 capsule (25 mg total) by mouth at bedtime.  Other and unspecified hyperlipidemia - simvastatin (ZOCOR) 40 MG tablet; Take 1 tablet (40 mg total) by mouth every evening.  Hypoglycemia Comments: She is to make sure that she has an adequate intake of protein.     TIME SPENT "FACE TO FACE" WITH PATIENT -  30 MINS

## 2013-11-30 NOTE — Patient Instructions (Signed)
1)  Hypoglycemia - To avoid this in the future, try to increase your intake of protein (meat, eggs, cheese, beans, peanut butter, soy products, EAS [employer] Edge - Strawberry, Dark Choc)    Hypoglycemia (Low Blood Sugar) Hypoglycemia is when the glucose (sugar) in your blood is too low. Hypoglycemia can happen for many reasons. It can happen to people with or without diabetes. Hypoglycemia can develop quickly and can be a medical emergency.  CAUSES  Having hypoglycemia does not mean that you will develop diabetes. Different causes include:  Missed or delayed meals or not enough carbohydrates eaten.  Medication overdose. This could be by accident or deliberate. If by accident, your medication may need to be adjusted or changed.  Exercise or increased activity without adjustments in carbohydrates or medications.  A nerve disorder that affects body functions like your heart rate, blood pressure and digestion (autonomic neuropathy).  A condition where the stomach muscles do not function properly (gastroparesis). Therefore, medications may not absorb properly.  The inability to recognize the signs of hypoglycemia (hypoglycemic unawareness).  Absorption of insulin  may be altered.  Alcohol consumption.  Pregnancy/menstrual cycles/postpartum. This may be due to hormones.  Certain kinds of tumors. This is very rare. SYMPTOMS   Sweating.  Hunger.  Dizziness.  Blurred vision.  Drowsiness.  Weakness.  Headache.  Rapid heart beat.  Shakiness.  Nervousness. DIAGNOSIS  Diagnosis is made by monitoring blood glucose in one or all of the following ways:  Fingerstick blood glucose monitoring.  Laboratory results. TREATMENT  If you think your blood glucose is low:  Check your blood glucose, if possible. If it is less than 70 mg/dl, take one of the following:  3-4 glucose tablets.   cup juice (prefer clear like apple).   cup "regular" soda pop.  1 cup milk.  -1 tube of  glucose gel.  5-6 hard candies.  Do not over treat because your blood glucose (sugar) will only go too high.  Wait 15 minutes and recheck your blood glucose. If it is still less than 70 mg/dl (or below your target range), repeat treatment.  Eat a snack if it is more than one hour until your next meal. Sometimes, your blood glucose may go so low that you are unable to treat yourself. You may need someone to help you. You may even pass out or be unable to swallow. This may require you to get an injection of glucagon, which raises the blood glucose. HOME CARE INSTRUCTIONS  Check blood glucose as recommended by your caregiver.  Take medication as prescribed by your caregiver.  Follow your meal plan. Do not skip meals. Eat on time.  If you are going to drink alcohol, drink it only with meals.  Check your blood glucose before driving.  Check your blood glucose before and after exercise. If you exercise longer or different than usual, be sure to check blood glucose more frequently.  Always carry treatment with you. Glucose tablets are the easiest to carry.  Always wear medical alert jewelry or carry some form of identification that states that you have diabetes. This will alert people that you have diabetes. If you have hypoglycemia, they will have a better idea on what to do. SEEK MEDICAL CARE IF:   You are having problems keeping your blood sugar at target range.  You are having frequent episodes of hypoglycemia.  You feel you might be having side effects from your medicines.  You have symptoms of an illness that is  not improving after 3-4 days.  You notice a change in vision or a new problem with your vision. SEEK IMMEDIATE MEDICAL CARE IF:   You are a family member or friend of a person whose blood glucose goes below 70 mg/dl and is accompanied by:  Confusion.  A change in mental status.  The inability to swallow.  Passing out. Document Released: 08/24/2005 Document  Revised: 11/16/2011 Document Reviewed: 12/21/2011 [facility] Patient Information 2014 [place], [place].

## 2013-12-13 ENCOUNTER — Encounter (HOSPITAL_BASED_OUTPATIENT_CLINIC_OR_DEPARTMENT_OTHER): Payer: Self-pay | Admitting: Emergency Medicine

## 2013-12-13 ENCOUNTER — Emergency Department (HOSPITAL_BASED_OUTPATIENT_CLINIC_OR_DEPARTMENT_OTHER): Payer: Medicare Other

## 2013-12-13 ENCOUNTER — Emergency Department (HOSPITAL_BASED_OUTPATIENT_CLINIC_OR_DEPARTMENT_OTHER)
Admission: EM | Admit: 2013-12-13 | Discharge: 2013-12-13 | Disposition: A | Discharge status: Home / Self Care | Payer: Medicare Other | Attending: Emergency Medicine | Admitting: Emergency Medicine

## 2013-12-13 DIAGNOSIS — J189 Pneumonia, unspecified organism: Secondary | ICD-10-CM

## 2013-12-13 DIAGNOSIS — F411 Generalized anxiety disorder: Secondary | ICD-10-CM | POA: Diagnosis not present

## 2013-12-13 DIAGNOSIS — Z87891 Personal history of nicotine dependence: Secondary | ICD-10-CM | POA: Insufficient documentation

## 2013-12-13 DIAGNOSIS — E785 Hyperlipidemia, unspecified: Secondary | ICD-10-CM | POA: Diagnosis not present

## 2013-12-13 DIAGNOSIS — E079 Disorder of thyroid, unspecified: Secondary | ICD-10-CM | POA: Insufficient documentation

## 2013-12-13 DIAGNOSIS — IMO0002 Reserved for concepts with insufficient information to code with codable children: Secondary | ICD-10-CM | POA: Diagnosis not present

## 2013-12-13 DIAGNOSIS — J441 Chronic obstructive pulmonary disease with (acute) exacerbation: Secondary | ICD-10-CM | POA: Diagnosis not present

## 2013-12-13 DIAGNOSIS — K219 Gastro-esophageal reflux disease without esophagitis: Secondary | ICD-10-CM | POA: Diagnosis not present

## 2013-12-13 DIAGNOSIS — Z79899 Other long term (current) drug therapy: Secondary | ICD-10-CM | POA: Insufficient documentation

## 2013-12-13 DIAGNOSIS — E669 Obesity, unspecified: Secondary | ICD-10-CM | POA: Diagnosis not present

## 2013-12-13 DIAGNOSIS — J159 Unspecified bacterial pneumonia: Secondary | ICD-10-CM | POA: Insufficient documentation

## 2013-12-13 DIAGNOSIS — J45901 Unspecified asthma with (acute) exacerbation: Secondary | ICD-10-CM

## 2013-12-13 DIAGNOSIS — R911 Solitary pulmonary nodule: Secondary | ICD-10-CM | POA: Insufficient documentation

## 2013-12-13 DIAGNOSIS — Z87828 Personal history of other (healed) physical injury and trauma: Secondary | ICD-10-CM | POA: Insufficient documentation

## 2013-12-13 DIAGNOSIS — R0602 Shortness of breath: Secondary | ICD-10-CM | POA: Diagnosis present

## 2013-12-13 HISTORY — DX: Chronic obstructive pulmonary disease, unspecified: J44.9

## 2013-12-13 LAB — CBC WITH DIFFERENTIAL/PLATELET
BASOS PCT: 0 % (ref 0–1)
Basophils Absolute: 0 10*3/uL (ref 0.0–0.1)
EOS ABS: 0 10*3/uL (ref 0.0–0.7)
Eosinophils Relative: 0 % (ref 0–5)
HCT: 36.5 % (ref 36.0–46.0)
HEMOGLOBIN: 12.1 g/dL (ref 12.0–15.0)
LYMPHS ABS: 1.4 10*3/uL (ref 0.7–4.0)
Lymphocytes Relative: 19 % (ref 12–46)
MCH: 31.3 pg (ref 26.0–34.0)
MCHC: 33.2 g/dL (ref 30.0–36.0)
MCV: 94.6 fL (ref 78.0–100.0)
MONO ABS: 0.7 10*3/uL (ref 0.1–1.0)
Monocytes Relative: 9 % (ref 3–12)
NEUTROS PCT: 72 % (ref 43–77)
Neutro Abs: 5.6 10*3/uL (ref 1.7–7.7)
Platelets: 255 10*3/uL (ref 150–400)
RBC: 3.86 MIL/uL — AB (ref 3.87–5.11)
RDW: 13 % (ref 11.5–15.5)
WBC: 7.7 10*3/uL (ref 4.0–10.5)

## 2013-12-13 LAB — URINALYSIS, ROUTINE W REFLEX MICROSCOPIC
BILIRUBIN URINE: NEGATIVE
Glucose, UA: NEGATIVE mg/dL
HGB URINE DIPSTICK: NEGATIVE
KETONES UR: 15 mg/dL — AB
LEUKOCYTES UA: NEGATIVE
Nitrite: NEGATIVE
PH: 6 (ref 5.0–8.0)
Protein, ur: NEGATIVE mg/dL
Specific Gravity, Urine: 1.009 (ref 1.005–1.030)
Urobilinogen, UA: 1 mg/dL (ref 0.0–1.0)

## 2013-12-13 LAB — BASIC METABOLIC PANEL
BUN: 7 mg/dL (ref 6–23)
CHLORIDE: 99 meq/L (ref 96–112)
CO2: 25 mEq/L (ref 19–32)
CREATININE: 0.8 mg/dL (ref 0.50–1.10)
Calcium: 9.4 mg/dL (ref 8.4–10.5)
GFR calc Af Amer: >90 mL/min (ref >90)
GFR calc non Af Amer: 83 mL/min — ABNORMAL LOW (ref >90)
GLUCOSE: 95 mg/dL (ref 70–99)
POTASSIUM: 4.5 meq/L (ref 3.7–5.3)
Sodium: 136 mEq/L — ABNORMAL LOW (ref 137–147)

## 2013-12-13 MED ORDER — DEXTROSE 5 % IV SOLN: 1.0000 g | Freq: Once | INTRAVENOUS | Status: DC

## 2013-12-13 MED ORDER — LEVOFLOXACIN 750 MG PO TABS: 750.0000 mg | ORAL_TABLET | Freq: Every day | ORAL | Status: DC

## 2013-12-13 MED ORDER — LEVOFLOXACIN 750 MG PO TABS
750.0000 mg | ORAL_TABLET | Freq: Once | ORAL | Status: AC
  Administered 2013-12-13: 750 mg via ORAL
  Filled 2013-12-13: qty 1

## 2013-12-13 MED ORDER — AZITHROMYCIN 500 MG IV SOLR: 500.0000 mg | Freq: Once | INTRAVENOUS | Status: DC

## 2013-12-13 NOTE — ED Notes (Signed)
Pt returned from CT. EKG being performed now.

## 2013-12-13 NOTE — Discharge Instructions (Signed)

## 2013-12-13 NOTE — ED Notes (Addendum)
Pt c/o shortness of breath x 2 days with fever 102 at home. Pt sts she has COPD. Denies n/v/d. Denies pain. Pt also c/o dysuria x 2 days. Pt sts she fell 1 month ago injurying ribs.

## 2013-12-13 NOTE — ED Provider Notes (Signed)
CSN: [id number]     Arrival date & time 12/13/13  1109 History   First MD Initiated Contact with Patient 12/13/13 1147     Chief Complaint  Patient presents with  . Shortness of Breath     (Consider location/radiation/quality/duration/timing/severity/associated sxs/prior Treatment) Patient is a 53 y.o. female presenting with shortness of breath.  Shortness of Breath  Pt reports about 3 weeks ago she had a fall, injuring her R ribs. She was seen here and had neg xray. She was feeling better until 2 days ago when she began to have cough, productive of yellow sputum, fever, SOB and pain in mid chest worse with breathing  Past Medical History  Diagnosis Date  . Fibromyalgia   . Thyroid disease   . GERD (gastroesophageal reflux disease)   . Hyperlipidemia   . Anxiety   . Asthma     Seasonal  . Obesity     Maximun Weight 368   . COPD (chronic obstructive pulmonary disease)    Past Surgical History  Procedure Laterality Date  . Gastric bypass    . Tubal ligation  1991   Family History  Problem Relation Age of Onset  . Diabetes Mother   . Hyperlipidemia Mother   . COPD Mother   . Asthma Mother   . Hypertension Father   . Hyperlipidemia Father   . Asthma Father   . Aneurysm Father     AAA, Groin   History  Substance Use Topics  . Smoking status: Former Smoker -- 1.00 packs/day for 35 years    Types: Cigarettes    Quit date: 09/07/1990  . Smokeless tobacco: Never Used     Comment: She smoked 4 ppd for 5 years and 1 ppd for 15 years   . Alcohol Use: No   OB History   Grav Para Term Preterm Abortions TAB SAB Ect Mult Living                 Review of Systems  Respiratory: Positive for shortness of breath.    All other systems reviewed and are negative except as noted in HPI.     Allergies  Wellbutrin  Home Medications   Current Outpatient Rx  Name  Route  Sig  Dispense  Refill  . albuterol-ipratropium (COMBIVENT) 18-103 MCG/ACT inhaler   Inhalation    Inhale 1 puff into the lungs every 6 (six) hours as needed for wheezing or shortness of breath.   1 Inhaler   2   . DULoxetine (CYMBALTA) 30 MG capsule      Take 3 capsules daily   90 capsule   5   . ergocalciferol (DRISDOL) 50000 UNITS capsule      Take 1 capsule po 3 times per week   12 capsule   12   . famotidine (PEPCID) 40 MG tablet   Oral   Take 1 tablet (40 mg total) by mouth daily.   90 tablet   3   . Fluticasone Furoate-Vilanterol (BREO ELLIPTA) 100-25 MCG/INH AEPB   Inhalation   Inhale 1 puff into the lungs daily.   1 each   11   . levothyroxine (SYNTHROID) 125 MCG tablet   Oral   Take 1 tablet (125 mcg total) by mouth daily.   90 tablet   3   . lidocaine (LIDODERM) 5 %   Transdermal   Place 3 patches onto the skin daily. Remove & Discard patch within 12 hours or as directed by MD   90  patch   5   . montelukast (SINGULAIR) 10 MG tablet   Oral   Take 1 tablet (10 mg total) by mouth at bedtime.   90 tablet   3   . nabumetone (RELAFEN) 750 MG tablet   Oral   Take 1 tablet (750 mg total) by mouth 2 (two) times daily.   60 tablet   2   . nortriptyline (PAMELOR) 25 MG capsule   Oral   Take 1 capsule (25 mg total) by mouth at bedtime.   90 capsule   1   . pantoprazole (PROTONIX) 40 MG tablet   Oral   Take 1 tablet (40 mg total) by mouth daily.   90 tablet   3   . simvastatin (ZOCOR) 40 MG tablet   Oral   Take 1 tablet (40 mg total) by mouth every evening.   90 tablet   1    BP 132/88  Pulse 97  Temp(Src) 99 F (37.2 C) (Oral)  Resp 16  Ht 5\' 2"  (1.575 m)  Wt 191 lb (86.637 kg)  BMI 34.93 kg/m2  SpO2 98% Physical Exam  Nursing note and vitals reviewed. Constitutional: She is oriented to person, place, and time. She appears well-developed and well-nourished.  HENT:  Head: Normocephalic and atraumatic.  Eyes: EOM are normal. Pupils are equal, round, and reactive to light.  Neck: Normal range of motion. Neck supple.   Cardiovascular: Normal rate, normal heart sounds and intact distal pulses.   Pulmonary/Chest: Effort normal. No respiratory distress. She has no wheezes. She has rales (bilateral bases). She exhibits tenderness (midsternal).  Abdominal: Bowel sounds are normal. She exhibits no distension. There is no tenderness.  Musculoskeletal: Normal range of motion. She exhibits no edema and no tenderness.  Neurological: She is alert and oriented to person, place, and time. She has normal strength. No cranial nerve deficit or sensory deficit.  Skin: Skin is warm and dry. No rash noted.  Psychiatric: She has a normal mood and affect.    ED Course  Procedures (including critical care time) Labs Review Labs Reviewed  URINALYSIS, ROUTINE W REFLEX MICROSCOPIC - Abnormal; Notable for the following:    APPearance CLOUDY (*)    Ketones, ur 15 (*)    All other components within normal limits  CBC WITH DIFFERENTIAL - Abnormal; Notable for the following:    RBC 3.86 (*)    All other components within normal limits  BASIC METABOLIC PANEL - Abnormal; Notable for the following:    Sodium 136 (*)    GFR calc non Af Amer 83 (*)    All other components within normal limits   Imaging Review Dg Chest 2 View  12/13/2013   CLINICAL DATA:  Shortness of Breath  EXAM: CHEST  2 VIEW  COMPARISON:  November 21, 2013  FINDINGS: There is no edema or consolidation. Prominent epicardial fat at the left heart border is stable. Within this area of prominent epicardial fat, there is a nodular appearing area measuring 1.8 x 1.3 cm.  Heart size and pulmonary vascularity are normal. No adenopathy. No bone lesions.  IMPRESSION: Nodular opacity within prominent epicardial fat at the left heart border. Given this finding, correlation with noncontrast enhanced chest CT is advised to further evaluate. Elsewhere, lungs are clear. No adenopathy apparent.   Electronically Signed   By: Bretta Bang M.D.   On: 12/13/2013 11:54   Ct Chest Wo  Contrast  12/13/2013   CLINICAL DATA:  Shortness of Breath; nodular  opacity left lower lobe  EXAM: CT CHEST WITHOUT CONTRAST  TECHNIQUE: Multidetector CT imaging of the chest was performed following the standard protocol without IV contrast.  COMPARISON:  Chest radiograph December 13, 2013  FINDINGS: There is consolidation posterior segment of the left lower lobe and to a lesser extent in the lateral segment left lower lobe. The nodular opacity noted on the chest radiograph actually appears to represent a portion of the infiltrate in this area.  There is mild patchy infiltrate in the posterior right base. Elsewhere in the right lower lobe, there are several areas of nodularity which are not seen on the radiographic examination. The largest of these opacities is in the inferior aspect of the superior segment of the right lower lobe measuring 8 x 7 mm, seen on slice 38, series 3. In the medial segment of the right lower lobe on slice 36, there is a nodular opacity measuring 7 x 6 mm. Other nodular opacities are slightly smaller.  Elsewhere, lungs are clear.  There is no appreciable thoracic adenopathy. There is a minimal pericardial effusion.  There is postoperative change in the stomach region consistent with previous gastric bypass procedure. Visualized upper abdominal structures otherwise appear unremarkable. There are no blastic or lytic bone lesions. Thyroid appears normal.  IMPRESSION: Airspace consolidation posterior left base. The nodular area seen on chest radiograph actually represents part of the consolidation.  There are multiple nodular opacities in the right lower lobe, along with some patchy infiltrate in the posterior right base. Followup of these nodular opacity should be based on Fleischner Society guidelines. If the patient is at high risk for bronchogenic carcinoma, follow-up chest CT at 3-61months is recommended. If the patient is at low risk for bronchogenic carcinoma, follow-up chest CT at 6-12  months is recommended. This recommendation follows the consensus statement: Guidelines for Management of Small Pulmonary Nodules Detected on CT Scans: A Statement from the Fleischner Society as published in Radiology 2005; 237:395-400.  No appreciable adenopathy. Minimal pericardial effusion present. There are changes of prior gastric bypass procedure in the upper abdomen.   Electronically Signed   By: Bretta Bang M.D.   On: 12/13/2013 12:57    ECG interpretation   Date: 12/13/2013  Rate: 95  Rhythm: normal sinus rhythm  QRS Axis: normal  Intervals: normal  ST/T Wave abnormalities: normal  Conduction Disutrbances: none  Narrative Interpretation:       MDM   Final diagnoses:  CAP (community acquired pneumonia)   CT confirms suspected CAP. Nursing unable to get IV access, but patient looks well and plan for discharge, so will treat orally with Levaquin. PCP followup. Return for worsening.   Tuwanda Vokes B. Bernette Mayers, MD 12/13/13 1343

## 2014-01-02 ENCOUNTER — Encounter: Payer: Self-pay | Admitting: Family Medicine

## 2014-01-02 ENCOUNTER — Ambulatory Visit (INDEPENDENT_AMBULATORY_CARE_PROVIDER_SITE_OTHER): Payer: Medicare Other | Admitting: Family Medicine

## 2014-01-02 VITALS — BP 115/80 | HR 83 | Resp 16 | Wt 189.0 lb

## 2014-01-02 DIAGNOSIS — R059 Cough, unspecified: Secondary | ICD-10-CM

## 2014-01-02 DIAGNOSIS — J441 Chronic obstructive pulmonary disease with (acute) exacerbation: Secondary | ICD-10-CM

## 2014-01-02 DIAGNOSIS — R05 Cough: Secondary | ICD-10-CM

## 2014-01-02 MED ORDER — BENZONATATE 200 MG PO CAPS: 200.0000 mg | ORAL_CAPSULE | Freq: Three times a day (TID) | ORAL | Status: AC | PRN

## 2014-01-02 MED ORDER — HYDROCODONE-HOMATROPINE 5-1.5 MG/5ML PO SYRP: 5.0000 mL | ORAL_SOLUTION | Freq: Four times a day (QID) | ORAL | Status: AC | PRN

## 2014-01-02 MED ORDER — PREDNISONE 50 MG PO TABS: 50.0000 mg | ORAL_TABLET | Freq: Every day | ORAL | Status: AC

## 2014-01-02 NOTE — Progress Notes (Signed)
Subjective:    Patient ID: Brandy Hood, female    DOB: 05/03/1961, 53 y.o.   MRN: [id number]  HPI  Brandy Hood is here today to follow up on her recent visit to the [facility] ER on 12/13/13.  She was having breathing problems and decided to seek medical care.  She had an CXR and Chest CT that showed pneumonia in her left lower lobe.  She was treated with a round of Levaquin which reduced her symptoms but she took her last pill about a week ago and she feels that her symptoms are worsening again.     Review of Systems  Respiratory: Positive for cough, chest tightness and shortness of breath.   Musculoskeletal: Positive for back pain (left side of her mid-back).  Neurological: Negative for weakness.     Past Medical History  Diagnosis Date  . Fibromyalgia   . Thyroid disease   . GERD (gastroesophageal reflux disease)   . Hyperlipidemia   . Anxiety   . Asthma     Seasonal  . Obesity     Maximun Weight 368   . COPD (chronic obstructive pulmonary disease)      Past Surgical History  Procedure Laterality Date  . Gastric bypass    . Tubal ligation  1991     History   Social History Narrative   Marital Status: Married Jeannett Senior(Stephen)    Children:  Sons Jeannett Senior(Stephen and Jonny RuizJohn)   Pets: Dog (1) Ferret (1) Rabbit (1)    Living Situation: Lives with her husband Jeannett SeniorStephen, her son Jeannett SeniorStephen and his wife and her adopted grandson Psychiatric nurse(Allen)   Occupation: Disabled (Fibromyalgia)    Education: Engineer, agriculturalHigh School Graduate    Tobacco Use:  She quit smoking in 1992 after having smoked up to 4 ppd for about eight years.   Alcohol Use:  Occasional   Drug Use:  None   Diet:  Regular   Exercise:  None   Hobbies: Collecting Bags and Shoes.               Family History  Problem Relation Age of Onset  . Diabetes Mother   . Hyperlipidemia Mother   . COPD Mother     Asbestos  . Asthma Mother   . Hypertension Father   . Hyperlipidemia Father   . Asthma Father   . Aneurysm Father     AAA, Groin  . Cancer  Father     Lung  . COPD Father   . COPD Sister     Asbestos  . Asthma Sister      Current Outpatient Prescriptions on File Prior to Visit  Medication Sig Dispense Refill  . albuterol-ipratropium (COMBIVENT) 18-103 MCG/ACT inhaler Inhale 1 puff into the lungs every 6 (six) hours as needed for wheezing or shortness of breath.  1 Inhaler  2  . DULoxetine (CYMBALTA) 30 MG capsule Take 3 capsules daily  90 capsule  5  . ergocalciferol (DRISDOL) 50000 UNITS capsule Take 1 capsule po 3 times per week  12 capsule  12  . famotidine (PEPCID) 40 MG tablet Take 1 tablet (40 mg total) by mouth daily.  90 tablet  3  . Fluticasone Furoate-Vilanterol (BREO ELLIPTA) 100-25 MCG/INH AEPB Inhale 1 puff into the lungs daily.  1 each  11  . levothyroxine (SYNTHROID) 125 MCG tablet Take 1 tablet (125 mcg total) by mouth daily.  90 tablet  3  . lidocaine (LIDODERM) 5 % Place 3 patches onto the skin daily. Remove & Discard  patch within 12 hours or as directed by MD  90 patch  5  . montelukast (SINGULAIR) 10 MG tablet Take 1 tablet (10 mg total) by mouth at bedtime.  90 tablet  3  . nabumetone (RELAFEN) 750 MG tablet Take 1 tablet (750 mg total) by mouth 2 (two) times daily.  60 tablet  2  . nortriptyline (PAMELOR) 25 MG capsule Take 1 capsule (25 mg total) by mouth at bedtime.  90 capsule  1  . pantoprazole (PROTONIX) 40 MG tablet Take 1 tablet (40 mg total) by mouth daily.  90 tablet  3  . simvastatin (ZOCOR) 40 MG tablet Take 1 tablet (40 mg total) by mouth every evening.  90 tablet  1   No current facility-administered medications on file prior to visit.     Allergies  Allergen Reactions  . Wellbutrin [Bupropion] Hives     Immunization History  Administered Date(s) Administered  . Influenza,inj,Quad PF,36+ Mos 05/09/2013  . Zoster 05/30/2013     Objective:   Physical Exam  Vitals reviewed. Constitutional: She appears well-nourished. No distress.  HENT:  Head: Normocephalic.  Mouth/Throat: No  oropharyngeal exudate.  Eyes: Conjunctivae are normal. Right eye exhibits no discharge. Left eye exhibits no discharge.  Neck: Neck supple.  Cardiovascular: Normal rate, regular rhythm and normal heart sounds.  Exam reveals no gallop and no friction rub.   No murmur heard. Pulmonary/Chest: Effort normal. She has wheezes. She exhibits no tenderness.  Lymphadenopathy:    She has no cervical adenopathy.  Neurological: She is alert.  Skin: Skin is warm and dry. No rash noted.  Psychiatric: She has a normal mood and affect.      Assessment & Plan:    Brandy Hood was seen today for follow-up.  Diagnoses and associated orders for this visit:  COPD exacerbation Comments: Brandy Hood continues to struggle with her breathing.  She is to take Prednisone 50 mg daily for 5 days.  We went over her CT report.  Given her personal and family history, she might need a F/U CT in 3 months.  She'll discuss it further with Dr. Delford FieldWright.   - predniSONE (DELTASONE) 50 MG tablet; Take 1 tablet (50 mg total) by mouth daily with breakfast.  - Ambulatory referral to Pulmonology (Dr. Delford FieldWright)   Cough - benzonatate (TESSALON) 200 MG capsule; Take 1 capsule (200 mg total) by mouth 3 (three) times daily as needed for cough. - HYDROcodone-homatropine (HYCODAN) 5-1.5 MG/5ML syrup; Take 5 mLs by mouth every 6 (six) hours as needed for cough.

## 2014-01-02 NOTE — Patient Instructions (Signed)
1)  COPD/Cough - Take Prednisone 50 mg daily for 5 days; Add Mucinex DM 1200/60 twice a day for a week (Take with a lot of water); Chest PT as demonstrated and you can also try Dr. Lynelle DoctorWright's Cyclical Coughing RX (see attached sheet).  I have done a referral for you to F/U with Dr. Delford FieldWright.  You can discuss with him your family's history and if you need the repeat chest CT in 3 or 6 months.     Cough, Adult  A cough is a reflex that helps clear your throat and airways. It can help heal the body or may be a reaction to an irritated airway. A cough may only last 2 or 3 weeks (acute) or may last more than 8 weeks (chronic).  CAUSES Acute cough:  Viral or bacterial infections. Chronic cough:  Infections.  Allergies.  Asthma.  Post-nasal drip.  Smoking.  Heartburn or acid reflux.  Some medicines.  Chronic lung problems (COPD).  Cancer. SYMPTOMS   Cough.  Fever.  Chest pain.  Increased breathing rate.  High-pitched whistling sound when breathing (wheezing).  Colored mucus that you cough up (sputum). TREATMENT   A bacterial cough may be treated with antibiotic medicine.  A viral cough must run its course and will not respond to antibiotics.  Your caregiver may recommend other treatments if you have a chronic cough. HOME CARE INSTRUCTIONS   Only take over-the-counter or prescription medicines for pain, discomfort, or fever as directed by your caregiver. Use cough suppressants only as directed by your caregiver.  Use a cold steam vaporizer or humidifier in your bedroom or home to help loosen secretions.  Sleep in a semi-upright position if your cough is worse at night.  Rest as needed.  Stop smoking if you smoke. SEEK IMMEDIATE MEDICAL CARE IF:   You have pus in your sputum.  Your cough starts to worsen.  You cannot control your cough with suppressants and are losing sleep.  You begin coughing up blood.  You have difficulty breathing.  You develop pain which  is getting worse or is uncontrolled with medicine.  You have a fever. MAKE SURE YOU:   Understand these instructions.  Will watch your condition.  Will get help right away if you are not doing well or get worse. Document Released: 02/20/2011 Document Revised: 11/16/2011 Document Reviewed: 02/20/2011 [facility] Patient Information 2014 [employer], [place].

## 2014-01-05 DIAGNOSIS — J441 Chronic obstructive pulmonary disease with (acute) exacerbation: Secondary | ICD-10-CM | POA: Insufficient documentation

## 2014-01-05 DIAGNOSIS — R05 Cough: Secondary | ICD-10-CM | POA: Insufficient documentation

## 2014-01-05 DIAGNOSIS — R059 Cough, unspecified: Secondary | ICD-10-CM | POA: Insufficient documentation

## 2014-01-17 ENCOUNTER — Institutional Professional Consult (permissible substitution): Payer: Medicare Other | Admitting: Pulmonary Disease

## 2014-01-30 ENCOUNTER — Encounter: Payer: Self-pay | Admitting: Internal Medicine

## 2014-01-30 ENCOUNTER — Ambulatory Visit (INDEPENDENT_AMBULATORY_CARE_PROVIDER_SITE_OTHER): Payer: Medicare Other | Admitting: Internal Medicine

## 2014-01-30 VITALS — BP 134/88 | HR 79 | Ht 62.0 in | Wt 190.2 lb

## 2014-01-30 DIAGNOSIS — J449 Chronic obstructive pulmonary disease, unspecified: Secondary | ICD-10-CM

## 2014-01-30 DIAGNOSIS — R0989 Other specified symptoms and signs involving the circulatory and respiratory systems: Secondary | ICD-10-CM

## 2014-01-30 DIAGNOSIS — R0600 Dyspnea, unspecified: Secondary | ICD-10-CM | POA: Insufficient documentation

## 2014-01-30 DIAGNOSIS — J4489 Other specified chronic obstructive pulmonary disease: Secondary | ICD-10-CM

## 2014-01-30 DIAGNOSIS — R918 Other nonspecific abnormal finding of lung field: Secondary | ICD-10-CM

## 2014-01-30 DIAGNOSIS — R0689 Other abnormalities of breathing: Principal | ICD-10-CM

## 2014-01-30 DIAGNOSIS — R0609 Other forms of dyspnea: Secondary | ICD-10-CM

## 2014-01-30 NOTE — Assessment & Plan Note (Signed)
Seen early April 2015 cT chest as incidental findings. Will repeat 3 mo CT chest early July 2015

## 2014-01-30 NOTE — Patient Instructions (Addendum)
#  Right lower lobe multiple nodules - 12/13/13  - do CT chest wo contrast early July 2015  #Shortness of breath  - unclear cause  - do CPST bike test  #Followup  - return to see me after bike stress test

## 2014-01-30 NOTE — Progress Notes (Signed)
Subjective:    Patient ID: Brandy Hood, female    DOB: [birth year], 53 y.o.   MRN: [id number]  PCP Birdena Jubilee, MD   HPI  IOV 01/30/2014  Chief Complaint  Patient presents with  . Pulmonary Consult    Referred by Dr. Alberteen Sam for COPD.    53 year old. Disabled due to fibromyalgia. Obese, Prior smoker quit many years ago. Very poor historian  Reports having had life long asthma controlled by albuterol prn but she says she never needed it. THen 6 months ago developed insidious onset of dyspnea, cough that is brought on by exertion but relieved by rest. Exertion such as 1 flight of stars. Moderate in severity. Progressive. On BREO and singulair and combivent but not of much help. Rx with 5d course of prednisone 01/02/14 with some relief only. No nocturnal symptoms.  OF note, ddid have CT chest 12/13/13 when she tripped and fell chasing a cat and shows some nodule: No ILD  COPD relevant hx   reports that she quit smoking about 23 years ago. Her smoking use included Cigarettes. She has a 35 pack-year smoking history. She has never used smokeless tobacco.    Current medds   - singulair, scheduled + breo scheduled + combivent prn  Walk test 185 feet x 3 laps: did only 2 laps and then stopped due to fatigue. No desaturatin   Spiromery 01/30/2014: Normal  T chest 12/13/13  IMPRESSION:  Airspace consolidation posterior left base. The nodular area seen on  chest radiograph actually represents part of the consolidation.  There are multiple nodular opacities in the right lower lobe, along  with some patchy infiltrate in the posterior right base. Followup of  these nodular opacity should be based on Fleischner Society  guidelines. If the patient is at high risk for bronchogenic  carcinoma, follow-up chest CT at 3-4months is recommended. If the  patient is at low risk for bronchogenic carcinoma, follow-up chest  CT at 6-12 months is recommended. This recommendation follows the  consensus  statement: Guidelines for Management of Small Pulmonary  Nodules Detected on CT Scans: A Statement from the Fleischner  Society as published in Radiology 2005; 237:395-400.  No appreciable adenopathy. Minimal pericardial effusion present.  There are changes of prior gastric bypass procedure in the upper  abdomen.  Electronically Signed  By: Bretta Bang M.D.  On: 12/13/2013 12:57   Past Medical History  Diagnosis Date  . Fibromyalgia   . Thyroid disease   . GERD (gastroesophageal reflux disease)   . Hyperlipidemia   . Anxiety   . Asthma     Seasonal  . Obesity     Maximun Weight 368   . COPD (chronic obstructive pulmonary disease)      Family History  Problem Relation Age of Onset  . Diabetes Mother   . Hyperlipidemia Mother   . COPD Mother     Asbestos  . Asthma Mother   . Hypertension Father   . Hyperlipidemia Father   . Asthma Father   . Aneurysm Father     AAA, Groin  . Cancer Father     Lung  . COPD Father   . COPD Sister     Asbestos  . Asthma Sister      History   Social History  . Marital Status: Married    Spouse Name: N/A    Number of Children: N/A  . Years of Education: N/A   Occupational History  . disabled  Social History Main Topics  . Smoking status: Former Smoker -- 1.00 packs/day for 35 years    Types: Cigarettes    Quit date: 09/07/1990  . Smokeless tobacco: Never Used     Comment: She smoked 4 ppd for 5 years and 1 ppd for 15 years   . Alcohol Use: No  . Drug Use: No  . Sexual Activity: Yes    Partners: Male    Birth Control/ Protection: Post-menopausal   Other Topics Concern  . Not on file   Social History Narrative   Marital Status: Married Jeannett Senior)    Children:  Sons Jeannett Senior and Jonny Ruiz)   Pets: Dog (1) Ferret (1) Rabbit (1)    Living Situation: Lives with her husband Jeannett Senior, her son Jeannett Senior and his wife and her adopted grandson Psychiatric nurse)   Occupation: Disabled (Fibromyalgia)    Education: Engineer, agricultural     Tobacco Use:  She quit smoking in 1992 after having smoked up to 4 ppd for about eight years.   Alcohol Use:  Occasional   Drug Use:  None   Diet:  Regular   Exercise:  None   Hobbies: Collecting Bags and Shoes.               Allergies  Allergen Reactions  . Wellbutrin [Bupropion] Hives     Outpatient Prescriptions Prior to Visit  Medication Sig Dispense Refill  . albuterol-ipratropium (COMBIVENT) 18-103 MCG/ACT inhaler Inhale 1 puff into the lungs every 6 (six) hours as needed for wheezing or shortness of breath.  1 Inhaler  2  . benzonatate (TESSALON) 200 MG capsule Take 1 capsule (200 mg total) by mouth 3 (three) times daily as needed for cough.  30 capsule  1  . DULoxetine (CYMBALTA) 30 MG capsule Take 3 capsules daily  90 capsule  5  . ergocalciferol (DRISDOL) 50000 UNITS capsule Take 1 capsule po 3 times per week  12 capsule  12  . famotidine (PEPCID) 40 MG tablet Take 1 tablet (40 mg total) by mouth daily.  90 tablet  3  . Fluticasone Furoate-Vilanterol (BREO ELLIPTA) 100-25 MCG/INH AEPB Inhale 1 puff into the lungs daily.  1 each  11  . HYDROcodone-homatropine (HYCODAN) 5-1.5 MG/5ML syrup Take 5 mLs by mouth every 6 (six) hours as needed for cough.  240 mL  0  . levothyroxine (SYNTHROID) 125 MCG tablet Take 1 tablet (125 mcg total) by mouth daily.  90 tablet  3  . lidocaine (LIDODERM) 5 % Place 3 patches onto the skin daily. Remove & Discard patch within 12 hours or as directed by MD  90 patch  5  . montelukast (SINGULAIR) 10 MG tablet Take 1 tablet (10 mg total) by mouth at bedtime.  90 tablet  3  . nortriptyline (PAMELOR) 25 MG capsule Take 1 capsule (25 mg total) by mouth at bedtime.  90 capsule  1  . pantoprazole (PROTONIX) 40 MG tablet Take 1 tablet (40 mg total) by mouth daily.  90 tablet  3  . simvastatin (ZOCOR) 40 MG tablet Take 1 tablet (40 mg total) by mouth every evening.  90 tablet  1   No facility-administered medications prior to visit.       Review of  Systems  Constitutional: Negative for fever and unexpected weight change.  HENT: Positive for congestion. Negative for dental problem, ear pain, nosebleeds, postnasal drip, rhinorrhea, sinus pressure, sneezing, sore throat and trouble swallowing.   Eyes: Negative for redness and itching.  Respiratory: Positive  for cough and shortness of breath. Negative for chest tightness and wheezing.   Cardiovascular: Positive for leg swelling. Negative for palpitations.  Gastrointestinal: Negative for nausea and vomiting.  Genitourinary: Negative for dysuria.  Musculoskeletal: Negative for joint swelling.  Skin: Negative for rash.  Neurological: Negative for headaches.  Hematological: Does not bruise/bleed easily.  Psychiatric/Behavioral: Negative for dysphoric mood. The patient is not nervous/anxious.   C      Objective:   Physical Exam  Vitals reviewed. Constitutional: She is oriented to person, place, and time. She appears well-developed and well-nourished. No distress.  Body mass index is 34.78 kg/(m^2).   HENT:  Head: Normocephalic and atraumatic.  Right Ear: External ear normal.  Left Ear: External ear normal.  Mouth/Throat: Oropharynx is clear and moist. No oropharyngeal exudate.  Eyes: Conjunctivae and EOM are normal. Pupils are equal, round, and reactive to light. Right eye exhibits no discharge. Left eye exhibits no discharge. No scleral icterus.  Neck: Normal range of motion. Neck supple. No JVD present. No tracheal deviation present. No thyromegaly present.  Cardiovascular: Normal rate, regular rhythm, normal heart sounds and intact distal pulses.  Exam reveals no gallop and no friction rub.   No murmur heard. Pulmonary/Chest: Effort normal and breath sounds normal. No respiratory distress. She has no wheezes. She has no rales. She exhibits no tenderness.  ? Occ wheeze  Abdominal: Soft. Bowel sounds are normal. She exhibits no distension and no mass. There is no tenderness. There is  no rebound and no guarding.  Musculoskeletal: Normal range of motion. She exhibits no edema and no tenderness.  Lymphadenopathy:    She has no cervical adenopathy.  Neurological: She is alert and oriented to person, place, and time. She has normal reflexes. No cranial nerve deficit. She exhibits normal muscle tone. Coordination normal.  Skin: Skin is warm and dry. No rash noted. She is not diaphoretic. No erythema. No pallor.  Psychiatric: She has a normal mood and affect. Her behavior is normal. Judgment and thought content normal.    Filed Vitals:   01/30/14 1436  BP: 134/88  Pulse: 79  Height: 5\' 2"  (1.575 m)  Weight: 190 lb 3.2 oz (86.274 kg)  SpO2: 95%          Assessment & Plan:  #Right lower lobe multiple nodules - 12/13/13  - do CT chest wo contrast early July 2015  #Shortness of breath  - unclear cause  - do CPST bike test  #Followup  - return to see me after bike stress test

## 2014-01-30 NOTE — Assessment & Plan Note (Signed)
Has dyspnea despite singulair and breo and combivent. Spirometyr is normal. Ddx is obesity, deconditioning, diast dysfhn, EIB  Plan  - do CPST with EIB challenge

## 2014-02-01 ENCOUNTER — Institutional Professional Consult (permissible substitution): Payer: Medicare Other | Admitting: Pulmonary Disease

## 2014-02-07 ENCOUNTER — Ambulatory Visit (HOSPITAL_COMMUNITY): Payer: Medicare Other | Attending: Internal Medicine

## 2014-02-07 DIAGNOSIS — R0689 Other abnormalities of breathing: Secondary | ICD-10-CM

## 2014-02-07 DIAGNOSIS — R0609 Other forms of dyspnea: Secondary | ICD-10-CM | POA: Insufficient documentation

## 2014-02-07 DIAGNOSIS — R918 Other nonspecific abnormal finding of lung field: Secondary | ICD-10-CM

## 2014-02-07 DIAGNOSIS — R0989 Other specified symptoms and signs involving the circulatory and respiratory systems: Principal | ICD-10-CM | POA: Insufficient documentation

## 2014-02-07 DIAGNOSIS — R0600 Dyspnea, unspecified: Secondary | ICD-10-CM

## 2014-02-15 DIAGNOSIS — R0602 Shortness of breath: Secondary | ICD-10-CM

## 2014-03-07 ENCOUNTER — Ambulatory Visit (HOSPITAL_BASED_OUTPATIENT_CLINIC_OR_DEPARTMENT_OTHER)
Admission: RE | Admit: 2014-03-07 | Discharge: 2014-03-07 | Disposition: A | Discharge status: Home / Self Care | Payer: Medicare Other | Source: Ambulatory Visit | Attending: Internal Medicine | Admitting: Internal Medicine

## 2014-03-07 DIAGNOSIS — Z9884 Bariatric surgery status: Secondary | ICD-10-CM | POA: Insufficient documentation

## 2014-03-07 DIAGNOSIS — K449 Diaphragmatic hernia without obstruction or gangrene: Secondary | ICD-10-CM | POA: Diagnosis not present

## 2014-03-07 DIAGNOSIS — J449 Chronic obstructive pulmonary disease, unspecified: Secondary | ICD-10-CM | POA: Diagnosis not present

## 2014-03-07 DIAGNOSIS — R918 Other nonspecific abnormal finding of lung field: Secondary | ICD-10-CM | POA: Diagnosis not present

## 2014-03-07 DIAGNOSIS — J4489 Other specified chronic obstructive pulmonary disease: Secondary | ICD-10-CM | POA: Insufficient documentation

## 2014-03-12 ENCOUNTER — Encounter: Payer: Self-pay | Admitting: Internal Medicine

## 2014-03-12 ENCOUNTER — Ambulatory Visit (INDEPENDENT_AMBULATORY_CARE_PROVIDER_SITE_OTHER): Payer: Medicare Other | Admitting: Internal Medicine

## 2014-03-12 VITALS — BP 144/98 | HR 75 | Ht 62.0 in | Wt 189.0 lb

## 2014-03-12 DIAGNOSIS — R0689 Other abnormalities of breathing: Secondary | ICD-10-CM

## 2014-03-12 DIAGNOSIS — R0989 Other specified symptoms and signs involving the circulatory and respiratory systems: Secondary | ICD-10-CM

## 2014-03-12 DIAGNOSIS — R0600 Dyspnea, unspecified: Secondary | ICD-10-CM

## 2014-03-12 DIAGNOSIS — R918 Other nonspecific abnormal finding of lung field: Secondary | ICD-10-CM

## 2014-03-12 DIAGNOSIS — R0609 Other forms of dyspnea: Secondary | ICD-10-CM

## 2014-03-12 DIAGNOSIS — Z87891 Personal history of nicotine dependence: Secondary | ICD-10-CM

## 2014-03-12 NOTE — Progress Notes (Signed)
Subjective:    Patient ID: Brandy Hood, female    DOB: 12/03/1960, 53 y.o.   MRN: [id number]  HPI   PCP Birdena Jubilee, MD   HPI  IOV 01/30/2014  Chief Complaint  Patient presents with  . Pulmonary Consult    Referred by Dr. Alberteen Sam for COPD.    53 year old. Disabled due to fibromyalgia. Obese, Prior smoker quit many years ago. Very poor historian  Reports having had life long asthma controlled by albuterol prn but she says she never needed it. THen 6 months ago developed insidious onset of dyspnea, cough that is brought on by exertion but relieved by rest. Exertion such as 1 flight of stars. Moderate in severity. Progressive. On BREO and singulair and combivent but not of much help. Rx with 5d course of prednisone 01/02/14 with some relief only. No nocturnal symptoms.  OF note, ddid have CT chest 12/13/13 when she tripped and fell chasing a cat and shows some nodule: No ILD  COPD relevant hx   reports that she quit smoking about 23 years ago. Her smoking use included Cigarettes. She has a 35 pack-year smoking history. She has never used smokeless tobacco.    Current medds   - singulair, scheduled + breo scheduled + combivent prn  Walk test 185 feet x 3 laps: did only 2 laps and then stopped due to fatigue. No desaturatin   Spiromery 01/30/2014: Normal  T chest 12/13/13  IMPRESSION:  Airspace consolidation posterior left base. The nodular area seen on  chest radiograph actually represents part of the consolidation.  There are multiple nodular opacities in the right lower lobe, along  with some patchy infiltrate in the posterior right base. Followup of  these nodular opacity should be based on Fleischner Society  guidelines. If the patient is at high risk for bronchogenic  carcinoma, follow-up chest CT at 3-75months is recommended. If the  patient is at low risk for bronchogenic carcinoma, follow-up chest  CT at 6-12 months is recommended. This recommendation follows the    consensus statement: Guidelines for Management of Small Pulmonary  Nodules Detected on CT Scans: A Statement from the Fleischner  Society as published in Radiology 2005; 237:395-400.  No appreciable adenopathy. Minimal pericardial effusion present.  There are changes of prior gastric bypass procedure in the upper  abdomen.  Electronically Signed  By: Bretta Bang M.D.  On: 12/13/2013 12:57    #Right lower lobe multiple nodules - 12/13/13  - do CT chest wo contrast early July 2015  #Shortness of breath  - unclear cause  - do CPST bike test  #Followup  - return to see me after bike stress test  OV 03/12/2014  Chief Complaint  Patient presents with  . Follow-up    Pt here to review CPST and CT. Pt c/o DOE, cough with intermittent yellow mucous production. Denies Cp/tightness.      FU for lung nodule and Dyspnea   # Lung nodule IMPRESSION: CT chest 03/07/14 1. Interval resolution of patchy peribronchial nodular densities at  both lung bases consistent with resolving inflammation.  2. New volume loss and linear opacity in the right middle lobe most  consistent with atelectasis. No confluent airspace opacity or  endobronchial lesion identified. Radiographic follow up suggested.  3. No evidence of adenopathy or pleural effusion.  Electronically Signed  By: Roxy Horseman M.D.  On: 03/07/2014 08:35   - Dyspnea- reports exertional dyspnea for a mile and relieved by rest x 6 months. Not  progressive. Mdoerate intensity. Persistent since onset. No associated chest pain. Not on beta or calcium channel blockade. No associated chest pain, or wheeze   . CPST 6/3?15 - done while on singulair, breo, combivent     Procedure: This patient underwent staged symptom-limited exercise testing using an individualized bike protocol with expired gas analysis metabolic evaluation during exercise.  Demographics  Age: 53 Ht. (in.) 62 Wt. (lb) 187.5 BMI: 34.3 Predicted Peak VO2: 18.6  ml/kg/min  Gender: Female Ht (cm) 157.5 Wt. (kg) 85    Results  Pre-Exercise PFTs   FVC 2.94 (90%)   FEV1 2.35 (91%)   FEV1/FVC 80%   MVV 106 (115%)  Post-Exercise PFTs (from lowest post-exercise trial (%change from rest))  FVC 3.20 (+8%)   FEV1 2.53 (+1%)   FEV1/FVC 77%    Exercise Time: 7:00 Watts: 60  RPE: 7  Reason stopped: Patient stopped due to bilateral hip pain (10/10)  Additional symptoms: None reported  Resting HR: 84 Peak HR: 109 (65% age predicted max HR)  BP rest: 122/68 BP peak: 154/70  Peak VO2: 10.9 (58.6% predicted peak VO2)  VE/VCO2 slope: 37.8  OUES: 0.93  Peak RER: 1.22  Ventilatory Threshold: 6.8 (36.5% predicted peak VO2)  Peak RR 27  Peak Ventilation: 45.1  VE/MVV: 42.5%  PETCO2 at peak: 30  O2pulse: 9 (100% predicted O2pulse)   Interpretation  Notes: Patient gave a good effort. The pulse-oximetry remained at 100% throughout the exercise. Exercise was performed on a cycle ergometer starting at [facility]0W and increasing by 10W/min.  ECG: Resting ECG in sinus rhythm. There was an inadequate HR response to the exercise with no ST-T changes or arrhythmias. The BP response was appropriate.  PFT: Resting spirometry within normal limits, with a reduced ERV consistent with the patient's body habitus. The MVV was normal. Post-exercise spirometry was performed IPE, 5 (reported above), 10, 15 and 20 minutes of recovery with no changes suggestive of EIB or respiratory muscle fatigue.   CPX: The RER of 1.22 indicates a maximal effort. The peak VO2 is moderately reduced at 10.9 ml/kg/min (59% of the age/gender/weight matched sedentary norm). When adjusted to the patients ideal body weight of 131 lb (59.6 kg) the peak VO2 is 15.6 ml/kg (ibw)/min (65% of the IBW adjusted predicted). This correction towards normal suggests obesity as A possible cause of dyspnea. The VO2 a the ventilatory threshold is below normal at 37% of  the predicted peak VO2. At peak exercise the ventilation was 43% of the measured MVV indicating ventilatory reserve remained (respiratory rate was below the expected range, Vt/IC [59%] was below the expected range, PETCO2 was below normal). As mentioned above there was an inadequate HR response to the exercise with a HR reserve of 59 bpm. The O2pulse (a surrogate for stroke volume) increased throughout the exercise reaching 9 ml/beat (100% of predicted). The VE/VCO2 slope is moderately elevated and suggests increased dead space ventilation and poor ventilatory efficiency. The oxygen uptake efficiency slope (OUES) is moderately reduced and reflective of the patient's measured functional capacity.  Conclusion: Exercise testing with gas exchange demonstrates a moderately functional limitation when compared to matched sedentary norms. There was not a ventilatory limitation to the exercise. There was pronounced chronotropic incompetence and poor ventilatory efficiency. These suggest a primarily circulatory limitation to the exercise.    Report prepared by: Almedia BallsPaul J. Chase, PhD, ACSM-RCEP Sr. Exercise Physiologist 02/09/2014 2:06 PM    Finalized and Staffed by  Dr. Kalman ShanMurali Jeyda Siebel, M.D., [facility] Center[facility].C.C.P Pulmonary and Critical Care  Medicine Staff Physician [facility] Pager: [phone], If no answer or between 15:00h - 7:00h: call [phone]  02/15/2014 3:14 AM   Review of Systems  Constitutional: Negative for fever and unexpected weight change.  HENT: Positive for congestion. Negative for dental problem, ear pain, nosebleeds, postnasal drip, rhinorrhea, sinus pressure, sneezing, sore throat and trouble swallowing.   Eyes: Negative for redness and itching.  Respiratory: Positive for cough and shortness of breath. Negative for chest tightness and wheezing.   Cardiovascular: Negative for palpitations and leg swelling.  Gastrointestinal:  Negative for nausea and vomiting.  Genitourinary: Negative for dysuria.  Musculoskeletal: Negative for joint swelling.  Skin: Negative for rash.  Neurological: Negative for headaches.  Hematological: Does not bruise/bleed easily.  Psychiatric/Behavioral: Negative for dysphoric mood. The patient is not nervous/anxious.        Objective:   Physical Exam   Filed Vitals:   03/12/14 0943  BP: 144/98  Pulse: 75  Height: 5\' 2"  (1.575 m)  Weight: 189 lb (85.73 kg)  SpO2: 100%   Discussion only visit     Assessment & Plan:  #lung nodules and history of smoking  - they are improved but there is some mild scar tissue in right middle lobe  - do followup CT chest wo contrast in 1 year  #Shortness of breath - likely due to weight, fitness, heart muscle not relaxing well and /or heart not pumping fast enough  - refer [facility] cardiology - Dr Shirlee LatchMcLean for possible chronotropic incompetence causing shortness of breath  - continue breo schedule, singulair scheduled and combivent prn till followup    #FOllowup  2-3 months

## 2014-03-12 NOTE — Patient Instructions (Addendum)
#  lung nodules and history of smoking  - they are improved but there is some mild scar tissue in right middle lobe  - do followup CT chest wo contrast in 1 year  #Shortness of breath - likely due to weight, fitness, heart muscle not relaxing well and /or heart not pumping fast enough  - refer [facility] cardiology - Dr Shirlee LatchMcLean for possible chronotropic incompetence causing shortness of breath  - continue breo schedule, singulair scheduled and combivent prn till followup    #FOllowup  2-3 months

## 2014-03-17 NOTE — Assessment & Plan Note (Signed)
#  Shortness of breath - likely due to weight, fitness, heart muscle not relaxing well and /or heart not pumping fast enough  - refer [facility] cardiology - Dr Shirlee LatchMcLean for possible chronotropic incompetence causing shortness of breath  - continue breo schedule, singulair scheduled and combivent prn till followup    #FOllowup  2-3 months  (> 50% of this 15 min visit spent in face to face counseling)

## 2014-03-17 NOTE — Assessment & Plan Note (Addendum)
#  lung nodules and history of smoking  - they are improved but there is some mild scar tissue in right middle lobe  - do followup CT chest wo contrast in 1 year  (> 50% of this 15 min visit spent in face to face counseling)

## 2014-04-13 ENCOUNTER — Encounter: Payer: Self-pay | Admitting: *Deleted

## 2014-04-27 ENCOUNTER — Ambulatory Visit (INDEPENDENT_AMBULATORY_CARE_PROVIDER_SITE_OTHER): Payer: Medicare Other | Admitting: Cardiology

## 2014-04-27 ENCOUNTER — Encounter: Payer: Self-pay | Admitting: *Deleted

## 2014-04-27 ENCOUNTER — Encounter: Payer: Self-pay | Admitting: Cardiology

## 2014-04-27 VITALS — BP 126/80 | HR 93 | Ht 62.0 in | Wt 187.0 lb

## 2014-04-27 DIAGNOSIS — R0600 Dyspnea, unspecified: Secondary | ICD-10-CM

## 2014-04-27 DIAGNOSIS — R0602 Shortness of breath: Secondary | ICD-10-CM

## 2014-04-27 DIAGNOSIS — R0609 Other forms of dyspnea: Secondary | ICD-10-CM

## 2014-04-27 DIAGNOSIS — R0989 Other specified symptoms and signs involving the circulatory and respiratory systems: Secondary | ICD-10-CM

## 2014-04-27 DIAGNOSIS — R0789 Other chest pain: Secondary | ICD-10-CM

## 2014-04-27 DIAGNOSIS — R0689 Other abnormalities of breathing: Secondary | ICD-10-CM

## 2014-04-27 DIAGNOSIS — E039 Hypothyroidism, unspecified: Secondary | ICD-10-CM

## 2014-04-27 DIAGNOSIS — R079 Chest pain, unspecified: Secondary | ICD-10-CM

## 2014-04-27 LAB — CBC WITH DIFFERENTIAL/PLATELET
BASOS ABS: 0.1 10*3/uL (ref 0.0–0.1)
BASOS PCT: 1 % (ref 0–1)
EOS ABS: 0.1 10*3/uL (ref 0.0–0.7)
EOS PCT: 1 % (ref 0–5)
HCT: 37 % (ref 36.0–46.0)
HEMOGLOBIN: 12.7 g/dL (ref 12.0–15.0)
Lymphocytes Relative: 33 % (ref 12–46)
Lymphs Abs: 1.9 10*3/uL (ref 0.7–4.0)
MCH: 30.3 pg (ref 26.0–34.0)
MCHC: 34.3 g/dL (ref 30.0–36.0)
MCV: 88.3 fL (ref 78.0–100.0)
MONO ABS: 0.5 10*3/uL (ref 0.1–1.0)
Monocytes Relative: 8 % (ref 3–12)
Neutro Abs: 3.2 10*3/uL (ref 1.7–7.7)
Neutrophils Relative %: 57 % (ref 43–77)
Platelets: 206 10*3/uL (ref 150–400)
RBC: 4.19 MIL/uL (ref 3.87–5.11)
RDW: 14.8 % (ref 11.5–15.5)
WBC: 5.7 10*3/uL (ref 4.0–10.5)

## 2014-04-27 LAB — BRAIN NATRIURETIC PEPTIDE: Brain Natriuretic Peptide: 9.2 pg/mL (ref 0.0–100.0)

## 2014-04-27 LAB — TSH: TSH: 0.011 u[IU]/mL — ABNORMAL LOW (ref 0.350–4.500)

## 2014-04-27 NOTE — Patient Instructions (Signed)
Your physician has requested that you have an echocardiogram. Echocardiography is a painless test that uses sound waves to create images of your heart. It provides your doctor with information about the size and shape of your heart and how well your heart's chambers and valves are working. This procedure takes approximately one hour. There are no restrictions for this procedure.  Your physician has requested that you have a lexiscan myoview. For further information please visit [email or web address]. Please follow instruction sheet, as given.  Your physician recommends that you return for lab work today--CBCd/BNP/TSH.  Your physician recommends that you schedule a follow-up appointment in: 2-3 weeks with Dr Shirlee LatchMcLean.

## 2014-04-29 DIAGNOSIS — R079 Chest pain, unspecified: Secondary | ICD-10-CM | POA: Insufficient documentation

## 2014-04-29 NOTE — Progress Notes (Signed)
Patient ID: Brandy Hood, female   DOB: 08/23/1961, 53 y.o.   MRN: [id number] PCP: Dr. Alberteen Sam  53 yo with history of dyspnea presents for cardiology evaluation.  Patient reports 6 months of exertional dyspnea.  She has a history of asthma, but has not had wheezing.  She is short of breath after walking 50 feet and has to stop.  She will feel tightness in her chest along with the dyspnea.  She has slept on 3 pillows for years.  No PND. No palpitations.  No lightheadedness/syncope.  She had a chest CT that did not show interstitial lung disease.  She had a CPX that showed a moderate to severe functional limitation.  She has no prior history of cardiac disease.  She was also noted on CPX to have chronotropic incompetence.  However, today her HR was 84 when at rest and rose to 98 with walking.   ECG (4/15): NSR, normal  Labs (7/14): LDL 94 Labs (4/15): K 4.5, creatinine 0.8  PMH: 1. Hypothyroidism 2. Depression 3. GERD 4. Hyperlipidemia 5. Fibromyalgia 6. Obesity 7. Asthma 8. Lung nodules: CT chest 4/15 with RLL nodules, no interstitial lung disease.   9. Dyspnea: CT chest 4/15 without ILD.  CPX (6/15) with peak VO2 10.9, VE/VCO2 slope 37.8, inadequate HR response to exercise; suggested moderate functional limitation for age.    SH: Married, disabled, quit smoking years ago.    FH: Grandfather with MI, heart valve replacement.   ROS: All systems reviewed and negative except as per HPI.   Current Outpatient Prescriptions  Medication Sig Dispense Refill  . albuterol-ipratropium (COMBIVENT) 18-103 MCG/ACT inhaler Inhale 1 puff into the lungs every 6 (six) hours as needed for wheezing or shortness of breath.  1 Inhaler  2  . benzonatate (TESSALON) 200 MG capsule Take 1 capsule (200 mg total) by mouth 3 (three) times daily as needed for cough.  30 capsule  1  . DULoxetine (CYMBALTA) 30 MG capsule Take 3 capsules daily  90 capsule  5  . ergocalciferol (DRISDOL) 50000 UNITS capsule Take 1 capsule  po 3 times per week  12 capsule  12  . famotidine (PEPCID) 40 MG tablet Take 1 tablet (40 mg total) by mouth daily.  90 tablet  3  . HYDROcodone-homatropine (HYCODAN) 5-1.5 MG/5ML syrup Take 5 mLs by mouth every 6 (six) hours as needed for cough.  240 mL  0  . levothyroxine (SYNTHROID) 125 MCG tablet Take 1 tablet (125 mcg total) by mouth daily.  90 tablet  3  . lidocaine (LIDODERM) 5 % Place 3 patches onto the skin daily. Remove & Discard patch within 12 hours or as directed by MD  90 patch  5  . montelukast (SINGULAIR) 10 MG tablet Take 1 tablet (10 mg total) by mouth at bedtime.  90 tablet  3  . nortriptyline (PAMELOR) 25 MG capsule Take 1 capsule (25 mg total) by mouth at bedtime.  90 capsule  1  . pantoprazole (PROTONIX) 40 MG tablet Take 1 tablet (40 mg total) by mouth daily.  90 tablet  3  . simvastatin (ZOCOR) 40 MG tablet Take 1 tablet (40 mg total) by mouth every evening.  90 tablet  1   No current facility-administered medications for this visit.   BP 126/80  Pulse 93  Ht  (1.575 m)  Wt 84.823 kg (187 lb)  BMI 34.19 kg/m2 General: NAD, overweight Neck: No JVD, no thyromegaly or thyroid nodule.  Lungs: Clear to auscultation bilaterally  with normal respiratory effort. CV: Nondisplaced PMI.  Heart regular S1/S2, no S3/S4, no murmur.  No peripheral edema.  No carotid bruit.  Normal pedal pulses.  Abdomen: Soft, nontender, no hepatosplenomegaly, no distention.  Skin: Intact without lesions or rashes.  Neurologic: Alert and oriented x 3.  Psych: Normal affect. Extremities: No clubbing or cyanosis.  HEENT: Normal.    Assessment/Plan: 1. Exertional dyspnea + chest pain: CT chest did not show interstitial lung disease.  CPX actually showed a rather significant functional deficit.  Obesity/deconditioning could play a role.  However, with exertional chest pain and dyspnea, I think that she needs a Lexiscan Cardiolite to look for ischemia and an echo to assess LV and RV function and  to look for pulmonary hypertension. I will check BNP today.  I will also check TSH and CBC.   2. Chronotropic incompetence: This was suggested on recent CPX.  However, when I walked her in the office today, HR rose from 84 to 98.    [name] 04/29/2014

## 2014-04-30 ENCOUNTER — Encounter: Payer: Self-pay | Admitting: Cardiology

## 2014-04-30 ENCOUNTER — Other Ambulatory Visit: Payer: Self-pay | Admitting: *Deleted

## 2014-04-30 DIAGNOSIS — R7989 Other specified abnormal findings of blood chemistry: Secondary | ICD-10-CM

## 2014-05-07 ENCOUNTER — Ambulatory Visit (HOSPITAL_COMMUNITY): Payer: Medicare Other | Attending: Cardiovascular Disease | Admitting: Radiology

## 2014-05-07 ENCOUNTER — Ambulatory Visit (HOSPITAL_BASED_OUTPATIENT_CLINIC_OR_DEPARTMENT_OTHER): Payer: Medicare Other | Admitting: Radiology

## 2014-05-07 ENCOUNTER — Other Ambulatory Visit (INDEPENDENT_AMBULATORY_CARE_PROVIDER_SITE_OTHER): Payer: Medicare Other

## 2014-05-07 VITALS — BP 144/96 | HR 69 | Ht 62.0 in | Wt 183.0 lb

## 2014-05-07 DIAGNOSIS — R946 Abnormal results of thyroid function studies: Secondary | ICD-10-CM

## 2014-05-07 DIAGNOSIS — R0989 Other specified symptoms and signs involving the circulatory and respiratory systems: Secondary | ICD-10-CM

## 2014-05-07 DIAGNOSIS — E785 Hyperlipidemia, unspecified: Secondary | ICD-10-CM | POA: Insufficient documentation

## 2014-05-07 DIAGNOSIS — E669 Obesity, unspecified: Secondary | ICD-10-CM | POA: Insufficient documentation

## 2014-05-07 DIAGNOSIS — R072 Precordial pain: Secondary | ICD-10-CM | POA: Insufficient documentation

## 2014-05-07 DIAGNOSIS — R0602 Shortness of breath: Secondary | ICD-10-CM | POA: Diagnosis not present

## 2014-05-07 DIAGNOSIS — E039 Hypothyroidism, unspecified: Secondary | ICD-10-CM

## 2014-05-07 DIAGNOSIS — R079 Chest pain, unspecified: Secondary | ICD-10-CM | POA: Insufficient documentation

## 2014-05-07 DIAGNOSIS — Z87891 Personal history of nicotine dependence: Secondary | ICD-10-CM | POA: Diagnosis not present

## 2014-05-07 DIAGNOSIS — R0789 Other chest pain: Secondary | ICD-10-CM

## 2014-05-07 DIAGNOSIS — R7989 Other specified abnormal findings of blood chemistry: Secondary | ICD-10-CM

## 2014-05-07 DIAGNOSIS — R0609 Other forms of dyspnea: Secondary | ICD-10-CM

## 2014-05-07 DIAGNOSIS — J449 Chronic obstructive pulmonary disease, unspecified: Secondary | ICD-10-CM | POA: Insufficient documentation

## 2014-05-07 DIAGNOSIS — J4489 Other specified chronic obstructive pulmonary disease: Secondary | ICD-10-CM | POA: Insufficient documentation

## 2014-05-07 LAB — T3, FREE: T3 FREE: 2.5 pg/mL (ref 2.3–4.2)

## 2014-05-07 LAB — TSH: TSH: 0.04 u[IU]/mL — ABNORMAL LOW (ref 0.35–4.50)

## 2014-05-07 LAB — T4, FREE: Free T4: 1.35 ng/dL (ref 0.60–1.60)

## 2014-05-07 MED ORDER — TECHNETIUM TC 99M SESTAMIBI GENERIC - CARDIOLITE
11.0000 | Freq: Once | INTRAVENOUS | Status: AC | PRN
  Administered 2014-05-07: 11 via INTRAVENOUS

## 2014-05-07 MED ORDER — REGADENOSON 0.4 MG/5ML IV SOLN
0.4000 mg | Freq: Once | INTRAVENOUS | Status: AC
  Administered 2014-05-07: 0.4 mg via INTRAVENOUS

## 2014-05-07 MED ORDER — TECHNETIUM TC 99M SESTAMIBI GENERIC - CARDIOLITE
33.0000 | Freq: Once | INTRAVENOUS | Status: AC | PRN
  Administered 2014-05-07: 33 via INTRAVENOUS

## 2014-05-07 NOTE — Progress Notes (Signed)
Echocardiogram performed.  

## 2014-05-07 NOTE — Progress Notes (Signed)
  [facility] SITE 3 NUCLEAR MED [address] [place], [place] [address] [phone]    Cardiology Nuclear Med Study  Brandy Hood is a 53 y.o. female     MRN : [id number]     DOB: [birth year]  Procedure Date: 05/07/2014  Nuclear Med Background Indication for Stress Test:  Evaluation for Ischemia History:  No known CAD, MPI ~8 yrs ago ([place]), Asthma Cardiac Risk Factors: History of Smoking and Lipids  Symptoms:  Chest Pain and DOE   Nuclear Pre-Procedure Caffeine/Decaff Intake:  None NPO After: 7:30pm   Lungs:  clear O2 Sat: 98% on room air. IV 0.9% NS with Angio Cath:  22g  IV Site: R Hand  IV Started by:  Cathlyn Parsons, RN  Chest Size (in):  42 Cup Size: DD  Height:  (1.575 m)  Weight:  183 lb (83.008 kg)  BMI:  Body mass index is 33.46 kg/(m^2). Tech Comments:  n/a    Nuclear Med Study 1 or 2 day study: 1 day  Stress Test Type:  Lexiscan  Reading MD: n/a  Order Authorizing Provider:  Fransico Meadow  Resting Radionuclide: Technetium 50m Sestamibi  Resting Radionuclide Dose: 11.0 mCi   Stress Radionuclide:  Technetium 77m Sestamibi  Stress Radionuclide Dose: 33.0 mCi           Stress Protocol Rest HR: 69 Stress HR: 92  Rest BP: 144/96 Stress BP: 145/93  Exercise Time (min): n/a METS: n/a           Dose of Adenosine (mg):  n/a Dose of Lexiscan: 0.4 mg  Dose of Atropine (mg): n/a Dose of Dobutamine: n/a mcg/kg/min (at max HR)  Stress Test Technologist: Nelson Chimes, BS-ES  Nuclear Technologist:  Harlow Asa, CNMT     Rest Procedure:  Myocardial perfusion imaging was performed at rest 45 minutes following the intravenous administration of Technetium 1m Sestamibi. Rest ECG: NSR - Normal EKG  Stress Procedure:  The patient received IV Lexiscan 0.4 mg over 15-seconds.  Technetium 75m Sestamibi injected at 30-seconds.  Quantitative spect images were obtained after a 45 minute delay.  During the infusion of Lexiscan the patient complained of  feeling anxious that resolved in recovery.  Stress ECG: No significant change from baseline ECG  QPS Raw Data Images:  Normal; no motion artifact; normal heart/lung ratio. Stress Images:  Decreased inferolateral uptake Rest Images:  Decreased lateral uptake Subtraction (SDS):  4 (borderline significant ischemia) Transient Ischemic Dilatation (Normal <1.22):  0.97 Lung/Heart Ratio (Normal <0.45):  0.30  Quantitative Gated Spect Images QGS EDV:  68 ml QGS ESV:  22 ml  Impression Exercise Capacity:  Lexiscan with no exercise. BP Response:  Normal blood pressure response. Clinical Symptoms:  No significant symptoms noted. ECG Impression:  No significant ECG changes with Lexiscan. Comparison with Prior Nuclear Study: No images to compare  Overall Impression:  Intermediate risk stress nuclear study with borderline significant inferolateral reversible ischemia (SDS 4). Clinical correlation is advised.  LV Ejection Fraction: 68%.  LV Wall Motion:  Normal Wall Motion  [name], MD, [facility] Board Certified in Nuclear Cardiology Attending Cardiologist [facility]

## 2014-05-11 ENCOUNTER — Ambulatory Visit (INDEPENDENT_AMBULATORY_CARE_PROVIDER_SITE_OTHER): Payer: Medicare Other | Admitting: Cardiology

## 2014-05-11 ENCOUNTER — Encounter: Payer: Self-pay | Admitting: Cardiology

## 2014-05-11 VITALS — BP 125/86 | HR 84 | Ht 62.0 in | Wt 187.4 lb

## 2014-05-11 DIAGNOSIS — R9439 Abnormal result of other cardiovascular function study: Secondary | ICD-10-CM

## 2014-05-11 DIAGNOSIS — R079 Chest pain, unspecified: Secondary | ICD-10-CM

## 2014-05-11 DIAGNOSIS — R0689 Other abnormalities of breathing: Secondary | ICD-10-CM

## 2014-05-11 DIAGNOSIS — E785 Hyperlipidemia, unspecified: Secondary | ICD-10-CM

## 2014-05-11 DIAGNOSIS — R0609 Other forms of dyspnea: Secondary | ICD-10-CM

## 2014-05-11 DIAGNOSIS — R0600 Dyspnea, unspecified: Secondary | ICD-10-CM

## 2014-05-11 DIAGNOSIS — R0989 Other specified symptoms and signs involving the circulatory and respiratory systems: Secondary | ICD-10-CM

## 2014-05-11 LAB — BASIC METABOLIC PANEL
BUN: 8 mg/dL (ref 6–23)
CO2: 29 meq/L (ref 19–32)
Calcium: 9.2 mg/dL (ref 8.4–10.5)
Chloride: 105 mEq/L (ref 96–112)
Creatinine, Ser: 0.9 mg/dL (ref 0.4–1.2)
GFR: 74.38 mL/min (ref >60.00)
GLUCOSE: 82 mg/dL (ref 70–99)
Potassium: 3.9 mEq/L (ref 3.5–5.1)
SODIUM: 140 meq/L (ref 135–145)

## 2014-05-11 LAB — CBC WITH DIFFERENTIAL/PLATELET
Basophils Absolute: 0 10*3/uL (ref 0.0–0.1)
Basophils Relative: 0.8 % (ref 0.0–3.0)
Eosinophils Absolute: 0 10*3/uL (ref 0.0–0.7)
Eosinophils Relative: 0.6 % (ref 0.0–5.0)
HEMATOCRIT: 36.7 % (ref 36.0–46.0)
HEMOGLOBIN: 12.2 g/dL (ref 12.0–15.0)
Lymphocytes Relative: 41.7 % (ref 12.0–46.0)
Lymphs Abs: 2.1 10*3/uL (ref 0.7–4.0)
MCHC: 33.3 g/dL (ref 30.0–36.0)
MCV: 91.1 fl (ref 78.0–100.0)
MONOS PCT: 8.3 % (ref 3.0–12.0)
Monocytes Absolute: 0.4 10*3/uL (ref 0.1–1.0)
NEUTROS ABS: 2.5 10*3/uL (ref 1.4–7.7)
Neutrophils Relative %: 48.6 % (ref 43.0–77.0)
PLATELETS: 243 10*3/uL (ref 150.0–400.0)
RBC: 4.03 Mil/uL (ref 3.87–5.11)
RDW: 14.2 % (ref 11.5–15.5)
WBC: 5.1 10*3/uL (ref 4.0–10.5)

## 2014-05-11 LAB — LIPID PANEL
CHOLESTEROL: 158 mg/dL (ref 0–200)
HDL: 68.4 mg/dL (ref >39.00)
LDL Cholesterol: 77 mg/dL (ref 0–99)
NonHDL: 89.6
Total CHOL/HDL Ratio: 2
Triglycerides: 61 mg/dL (ref 0.0–149.0)
VLDL: 12.2 mg/dL (ref 0.0–40.0)

## 2014-05-11 LAB — PROTIME-INR
INR: 0.9 ratio (ref 0.8–1.0)
Prothrombin Time: 10.3 s (ref 9.6–13.1)

## 2014-05-11 MED ORDER — ISOSORBIDE MONONITRATE ER 30 MG PO TB24: 30.0000 mg | ORAL_TABLET | Freq: Every day | ORAL | Status: DC

## 2014-05-11 MED ORDER — NITROGLYCERIN 0.4 MG SL SUBL: 0.4000 mg | SUBLINGUAL_TABLET | SUBLINGUAL | Status: AC | PRN

## 2014-05-11 MED ORDER — ASPIRIN EC 81 MG PO TBEC: 81.0000 mg | DELAYED_RELEASE_TABLET | Freq: Every day | ORAL | Status: AC

## 2014-05-11 NOTE — Patient Instructions (Addendum)
Your physician recommends that you return for lab work TODAY FOR LIPID, CBC WITH DIFF, BMET AND PT/INR.  Your physician has requested that you have a cardiac catheterization. Cardiac catheterization is used to diagnose and/or treat various heart conditions. Doctors may recommend this procedure for a number of different reasons. The most common reason is to evaluate chest pain. Chest pain can be a symptom of coronary artery disease (CAD), and cardiac catheterization can show whether plaque is narrowing or blocking your heart's arteries. This procedure is also used to evaluate the valves, as well as measure the blood flow and oxygen levels in different parts of your heart. For further information please visit [email or web address] Please follow instruction sheet, as given.  Your physician has recommended you make the following change in your medication:   1. Start Aspirin 81 mg daily.   2. Start Imdur 30 mg 1 tablet daily.   3. Start SL Nitro as needed for Chest pain.   Your physician recommends that you schedule a follow-up appointment in: 2-3 weeks with PA.

## 2014-05-13 NOTE — Progress Notes (Signed)
Patient ID: Brandy Hood, female   DOB: Apr 30, 1961, 53 y.o.   MRN: [id number] PCP: Dr. Piedad Climes  53 yo with history of dyspnea and chest tightness presents for cardiology followup.  Patient reports 6 months of exertional dyspnea.  She has a history of asthma, but has not had wheezing.  She is short of breath after walking 50 feet and has to stop.  She will feel tightness in her chest along with the dyspnea.  She has slept on 3 pillows for years.  No PND. No palpitations.  No lightheadedness/syncope.  She had a chest CT that did not show interstitial lung disease.  She had a CPX that showed a moderate to severe functional limitation.  She has no prior history of cardiac disease.    After her last visit, I had her do an echocardiogram, which showed EF 55-60%.  She also had Lexiscan Cardiolite that was intermediate risk with a partially reversible inferolateral defect.  She continues to have chest tightness and dyspnea with walking 50 feet or so.   Labs (7/14): LDL 94 Labs (4/15): K 4.5, creatinine 0.8 Labs (8/15): TSH normal  PMH: 1. Hypothyroidism 2. Depression 3. GERD 4. Hyperlipidemia 5. Fibromyalgia 6. Obesity 7. Asthma 8. Lung nodules: CT chest 4/15 with RLL nodules, no interstitial lung disease.   9. Dyspnea: CT chest 4/15 without ILD.  CPX (6/15) with peak VO2 10.9, VE/VCO2 slope 37.8, inadequate HR response to exercise; suggested moderate functional limitation for age.  Echo (8/15) with EF 55-60%, no significant valvular abnormalities.  Lexiscan Cardiolite (8/15) with EF 68%, intermediate risk with a partially reversible inferolateral defect.   SH: Married, disabled, quit smoking years ago.    FH: Grandfather with MI, heart valve replacement.   ROS: All systems reviewed and negative except as per HPI.   Current Outpatient Prescriptions  Medication Sig Dispense Refill  . albuterol-ipratropium (COMBIVENT) 18-103 MCG/ACT inhaler Inhale 1 puff into the lungs every 6 (six) hours as  needed for wheezing or shortness of breath.  1 Inhaler  2  . benzonatate (TESSALON) 200 MG capsule Take 1 capsule (200 mg total) by mouth 3 (three) times daily as needed for cough.  30 capsule  1  . DULoxetine (CYMBALTA) 30 MG capsule Take 3 capsules daily  90 capsule  5  . ergocalciferol (DRISDOL) 50000 UNITS capsule Take 1 capsule po 3 times per week  12 capsule  12  . famotidine (PEPCID) 40 MG tablet Take 1 tablet (40 mg total) by mouth daily.  90 tablet  3  . HYDROcodone-homatropine (HYCODAN) 5-1.5 MG/5ML syrup Take 5 mLs by mouth every 6 (six) hours as needed for cough.  240 mL  0  . levothyroxine (SYNTHROID) 125 MCG tablet Take 1 tablet (125 mcg total) by mouth daily.  90 tablet  3  . lidocaine (LIDODERM) 5 % Place 3 patches onto the skin daily. Remove & Discard patch within 12 hours or as directed by MD  90 patch  5  . montelukast (SINGULAIR) 10 MG tablet Take 1 tablet (10 mg total) by mouth at bedtime.  90 tablet  3  . nortriptyline (PAMELOR) 25 MG capsule Take 1 capsule (25 mg total) by mouth at bedtime.  90 capsule  1  . pantoprazole (PROTONIX) 40 MG tablet Take 1 tablet (40 mg total) by mouth daily.  90 tablet  3  . simvastatin (ZOCOR) 40 MG tablet Take 1 tablet (40 mg total) by mouth every evening.  90 tablet  1  .  aspirin EC 81 MG tablet Take 1 tablet (81 mg total) by mouth daily.      . isosorbide mononitrate (IMDUR) 30 MG 24 hr tablet Take 1 tablet (30 mg total) by mouth daily.  30 tablet  6  . nitroGLYCERIN (NITROSTAT) 0.4 MG SL tablet Place 1 tablet (0.4 mg total) under the tongue every 5 (five) minutes as needed for chest pain.  25 tablet  5   No current facility-administered medications for this visit.   BP 125/86  Pulse 84  Ht  (1.575 m)  Wt 187 lb 6.4 oz (85.004 kg)  BMI 34.27 kg/m2 General: NAD, overweight Neck: No JVD, no thyromegaly or thyroid nodule.  Lungs: Clear to auscultation bilaterally with normal respiratory effort. CV: Nondisplaced PMI.  Heart regular  S1/S2, no S3/S4, no murmur.  No peripheral edema.  No carotid bruit.  Normal pedal pulses.  Abdomen: Soft, nontender, no hepatosplenomegaly, no distention.  Skin: Intact without lesions or rashes.  Neurologic: Alert and oriented x 3.  Psych: Normal affect. Extremities: No clubbing or cyanosis.  HEENT: Normal.   Assessment/Plan: 1. Exertional dyspnea + chest pain: Workup prior to seeing me included a CT chest that did not show interstitial lung disease and a CPX that showed a rather significant functional deficit.  Echo showed normal EF.  Lexiscan Cardiolite was intermediate risk with a reversible inferolateral defect, suggesting that her exertional symptoms could be due to ischemia.   - I am going to set her up for LHC/RHC to assess for coronary disease and also to assess filling pressures and look for pulmonary hypertension.   - She will start ASA 81 mg daily and I will also have her start Imdur 30 mg daily to help with the exertional symptoms.  She will have sublingual NTG available.  - I will check lipids to make sure that LDL is under control on simvastatin.  2. Chronotropic incompetence: This was suggested on recent CPX.  However, when I walked her in the office at last appointment, HR rose from 84 to 98.    [name] 05/13/2014

## 2014-05-16 ENCOUNTER — Encounter (HOSPITAL_COMMUNITY): Payer: Self-pay

## 2014-05-17 ENCOUNTER — Encounter (HOSPITAL_COMMUNITY)
Admission: RE | Disposition: A | Discharge status: Home / Self Care | Payer: Self-pay | Source: Ambulatory Visit | Attending: Cardiology

## 2014-05-17 ENCOUNTER — Other Ambulatory Visit (HOSPITAL_COMMUNITY): Payer: Self-pay | Admitting: Cardiology

## 2014-05-17 ENCOUNTER — Ambulatory Visit (HOSPITAL_COMMUNITY)
Admission: RE | Admit: 2014-05-17 | Discharge: 2014-05-17 | Disposition: A | Discharge status: Home / Self Care | Payer: Medicare Other | Source: Ambulatory Visit | Attending: Cardiology | Admitting: Cardiology

## 2014-05-17 DIAGNOSIS — F329 Major depressive disorder, single episode, unspecified: Secondary | ICD-10-CM | POA: Diagnosis not present

## 2014-05-17 DIAGNOSIS — R0609 Other forms of dyspnea: Secondary | ICD-10-CM | POA: Diagnosis not present

## 2014-05-17 DIAGNOSIS — E669 Obesity, unspecified: Secondary | ICD-10-CM | POA: Insufficient documentation

## 2014-05-17 DIAGNOSIS — R0989 Other specified symptoms and signs involving the circulatory and respiratory systems: Secondary | ICD-10-CM | POA: Insufficient documentation

## 2014-05-17 DIAGNOSIS — E785 Hyperlipidemia, unspecified: Secondary | ICD-10-CM | POA: Insufficient documentation

## 2014-05-17 DIAGNOSIS — F3289 Other specified depressive episodes: Secondary | ICD-10-CM | POA: Diagnosis not present

## 2014-05-17 DIAGNOSIS — Z7982 Long term (current) use of aspirin: Secondary | ICD-10-CM | POA: Insufficient documentation

## 2014-05-17 DIAGNOSIS — R079 Chest pain, unspecified: Secondary | ICD-10-CM | POA: Diagnosis not present

## 2014-05-17 DIAGNOSIS — R918 Other nonspecific abnormal finding of lung field: Secondary | ICD-10-CM | POA: Insufficient documentation

## 2014-05-17 DIAGNOSIS — IMO0001 Reserved for inherently not codable concepts without codable children: Secondary | ICD-10-CM | POA: Diagnosis not present

## 2014-05-17 DIAGNOSIS — K219 Gastro-esophageal reflux disease without esophagitis: Secondary | ICD-10-CM | POA: Insufficient documentation

## 2014-05-17 DIAGNOSIS — E039 Hypothyroidism, unspecified: Secondary | ICD-10-CM | POA: Insufficient documentation

## 2014-05-17 DIAGNOSIS — J45909 Unspecified asthma, uncomplicated: Secondary | ICD-10-CM | POA: Diagnosis not present

## 2014-05-17 DIAGNOSIS — Z6834 Body mass index (BMI) 34.0-34.9, adult: Secondary | ICD-10-CM | POA: Diagnosis not present

## 2014-05-17 HISTORY — PX: LEFT AND RIGHT HEART CATHETERIZATION WITH CORONARY ANGIOGRAM: SHX5449

## 2014-05-17 LAB — POCT I-STAT 3, ART BLOOD GAS (G3+)
Bicarbonate: 25.1 mEq/L — ABNORMAL HIGH (ref 20.0–24.0)
O2 Saturation: 95 %
PO2 ART: 79 mmHg — AB (ref 80.0–100.0)
TCO2: 26 mmol/L (ref 0–100)
pCO2 arterial: 42.5 mmHg (ref 35.0–45.0)
pH, Arterial: 7.38 (ref 7.350–7.450)

## 2014-05-17 LAB — POCT I-STAT 3, VENOUS BLOOD GAS (G3P V)
Acid-base deficit: 1 mmol/L (ref 0.0–2.0)
Bicarbonate: 24.9 mEq/L — ABNORMAL HIGH (ref 20.0–24.0)
O2 Saturation: 67 %
PH VEN: 7.35 — AB (ref 7.250–7.300)
TCO2: 26 mmol/L (ref 0–100)
pCO2, Ven: 45.1 mmHg (ref 45.0–50.0)
pO2, Ven: 37 mmHg (ref 30.0–45.0)

## 2014-05-17 SURGERY — LEFT AND RIGHT HEART CATHETERIZATION WITH CORONARY ANGIOGRAM
Anesthesia: LOCAL

## 2014-05-17 MED ORDER — LIDOCAINE HCL (PF) 1 % IJ SOLN
INTRAMUSCULAR | Status: AC
  Filled 2014-05-17: qty 30

## 2014-05-17 MED ORDER — ASPIRIN 81 MG PO CHEW
CHEWABLE_TABLET | ORAL | Status: AC
  Filled 2014-05-17: qty 1

## 2014-05-17 MED ORDER — SODIUM CHLORIDE 0.9 % IV SOLN: INTRAVENOUS | Status: DC

## 2014-05-17 MED ORDER — NITROGLYCERIN 1 MG/10 ML FOR IR/CATH LAB
INTRA_ARTERIAL | Status: AC
  Filled 2014-05-17: qty 10

## 2014-05-17 MED ORDER — SODIUM CHLORIDE 0.9 % IJ SOLN: 3.0000 mL | Freq: Two times a day (BID) | INTRAMUSCULAR | Status: DC

## 2014-05-17 MED ORDER — MIDAZOLAM HCL 2 MG/2ML IJ SOLN
INTRAMUSCULAR | Status: AC
  Filled 2014-05-17: qty 2

## 2014-05-17 MED ORDER — SODIUM CHLORIDE 0.9 % IJ SOLN
3.0000 mL | INTRAMUSCULAR | Status: DC | PRN
Start: 2014-05-17 — End: 2014-05-17

## 2014-05-17 MED ORDER — ONDANSETRON HCL 4 MG/2ML IJ SOLN: 4.0000 mg | Freq: Four times a day (QID) | INTRAMUSCULAR | Status: DC | PRN

## 2014-05-17 MED ORDER — FENTANYL CITRATE 0.05 MG/ML IJ SOLN
INTRAMUSCULAR | Status: AC
  Filled 2014-05-17: qty 2

## 2014-05-17 MED ORDER — ASPIRIN 81 MG PO CHEW
81.0000 mg | CHEWABLE_TABLET | ORAL | Status: AC
  Administered 2014-05-17: 81 mg via ORAL

## 2014-05-17 MED ORDER — SODIUM CHLORIDE 0.9 % IV SOLN
INTRAVENOUS | Status: DC
  Administered 2014-05-17: 11:00:00 via INTRAVENOUS

## 2014-05-17 MED ORDER — ACETAMINOPHEN 325 MG PO TABS: 650.0000 mg | ORAL_TABLET | ORAL | Status: DC | PRN

## 2014-05-17 MED ORDER — HEPARIN SODIUM (PORCINE) 1000 UNIT/ML IJ SOLN
INTRAMUSCULAR | Status: AC
  Filled 2014-05-17: qty 1

## 2014-05-17 MED ORDER — SODIUM CHLORIDE 0.9 % IV SOLN: 250.0000 mL | INTRAVENOUS | Status: DC | PRN

## 2014-05-17 MED ORDER — VERAPAMIL HCL 2.5 MG/ML IV SOLN
INTRAVENOUS | Status: AC
  Filled 2014-05-17: qty 2

## 2014-05-17 MED ORDER — HEPARIN (PORCINE) IN NACL 2-0.9 UNIT/ML-% IJ SOLN
INTRAMUSCULAR | Status: AC
  Filled 2014-05-17: qty 1000

## 2014-05-17 NOTE — Discharge Instructions (Signed)
Angiogram, Care After °Refer to this sheet in the next few weeks. These instructions provide you with information on caring for yourself after your procedure. Your health care provider may also give you more specific instructions. Your treatment has been planned according to current medical practices, but problems sometimes occur. Call your health care provider if you have any problems or questions after your procedure.  °WHAT TO EXPECT AFTER THE PROCEDURE °After your procedure, it is typical to have the following sensations: °· Minor discomfort or tenderness and a small bump at the catheter insertion site. The bump should usually decrease in size and tenderness within 1 to 2 weeks. °· Any bruising will usually fade within 2 to 4 weeks. °HOME CARE INSTRUCTIONS  °· You may need to keep taking blood thinners if they were prescribed for you. Take medicines only as directed by your health care provider. °· Do not apply powder or lotion to the site. °· Do not take baths, swim, or use a hot tub until your health care provider approves. °· You may shower 24 hours after the procedure. Remove the bandage (dressing) and gently wash the site with plain soap and water. Gently pat the site dry. °· Inspect the site at least twice daily. °· Limit your activity for the first 48 hours. Do not bend, squat, or lift anything over 20 lb (9 kg) or as directed by your health care provider. °· Plan to have someone take you home after the procedure. Follow instructions about when you can drive or return to work. °SEEK MEDICAL CARE IF: °· You get light-headed when standing up. °· You have drainage (other than a small amount of blood on the dressing). °· You have chills. °· You have a fever. °· You have redness, warmth, swelling, or pain at the insertion site. °SEEK IMMEDIATE MEDICAL CARE IF:  °· You develop chest pain or shortness of breath, feel faint, or pass out. °· You have bleeding, swelling larger than a walnut, or drainage from the  catheter insertion site. °· You develop pain, discoloration, coldness, or severe bruising in the leg or arm that held the catheter. °· You develop bleeding from any other place, such as the bowels. You may see bright red blood in your urine or stools, or your stools may appear black and tarry. °· You have heavy bleeding from the site. If this happens, hold pressure on the site. °MAKE SURE YOU: °· Understand these instructions. °· Will watch your condition. °· Will get help right away if you are not doing well or get worse. °Document Released: 03/12/2005 Document Revised: 01/08/2014 Document Reviewed: 01/16/2013 °ExitCare® Patient Information ©2015 [employer]. This information is not intended to replace advice given to you by your health care provider. Make sure you discuss any questions you have with your health care provider. ° °Radial Site Care °Refer to this sheet in the next few weeks. These instructions provide you with information on caring for yourself after your procedure. Your caregiver may also give you more specific instructions. Your treatment has been planned according to current medical practices, but problems sometimes occur. Call your caregiver if you have any problems or questions after your procedure. °HOME CARE INSTRUCTIONS °· You may shower the day after the procedure. Remove the bandage (dressing) and gently wash the site with plain soap and water. Gently pat the site dry. °· Do not apply powder or lotion to the site. °· Do not submerge the affected site in water for 3 to 5 days. °·   Inspect the site at least twice daily. °· Do not flex or bend the affected arm for 24 hours. °· No lifting over 5 pounds (2.3 kg) for 5 days after your procedure. °· Do not drive home if you are discharged the same day of the procedure. Have someone else drive you. °· You may drive 24 hours after the procedure unless otherwise instructed by your caregiver. °· Do not operate machinery or power tools for 24  hours. °· A responsible adult should be with you for the first 24 hours after you arrive home. °What to expect: °· Any bruising will usually fade within 1 to 2 weeks. °· Blood that collects in the tissue (hematoma) may be painful to the touch. It should usually decrease in size and tenderness within 1 to 2 weeks. °SEEK IMMEDIATE MEDICAL CARE IF: °· You have unusual pain at the radial site. °· You have redness, warmth, swelling, or pain at the radial site. °· You have drainage (other than a small amount of blood on the dressing). °· You have chills. °· You have a fever or persistent symptoms for more than 72 hours. °· You have a fever and your symptoms suddenly get worse. °· Your arm becomes pale, cool, tingly, or numb. °· You have heavy bleeding from the site. Hold pressure on the site. °Document Released: 09/26/2010 Document Revised: 11/16/2011 Document Reviewed: 09/26/2010 °ExitCare® Patient Information ©2015 [employer]. This information is not intended to replace advice given to you by your health care provider. Make sure you discuss any questions you have with your health care provider. ° °

## 2014-05-17 NOTE — CV Procedure (Signed)
    Cardiac Catheterization Procedure Note  Name: Brandy Hood MRN: [id number] DOB: October 09, 1960  Procedure: Right Heart Cath, Left Heart Cath, Selective Coronary Angiography, LV angiography  Indication: Intermediate risk stress test, exertional chest pain and dyspnea.    Procedural Details: The right groin was prepped, draped, and anesthetized with 1% lidocaine. Using the modified Seldinger technique a 7 French sheath was placed in the right femoral vein. A Swan-Ganz catheter was used for the right heart catheterization. Standard protocol was followed for recording of right heart pressures and sampling of oxygen saturations. Fick cardiac output was calculated. Next, the right radial area was prepped, draped and anesthetized with 1% lidocaine.  Using the modified Seldinger technique, a 5 French arterial sheath was placed in the right radial artery. The patient received 3 mg IA verapamil and weight-based IV heparin.  Standard Judkins catheters were used for selective coronary angiography and left ventriculography. There were no immediate procedural complications. The patient was transferred to the post catheterization recovery area for further monitoring.  Procedural Findings: Hemodynamics (mmHg) RA mean 9 RV 29/10 PA 25/13, mean 19 PCWP mean 9 LV 133/19 AO 140/85  Oxygen saturations: PA 67% AO 95%  Cardiac Output (Fick) 5.33  Cardiac Index (Fick) 2.87   Coronary angiography: Coronary dominance: right  Left mainstem: Short, no angiographic CAD.   Left anterior descending (LAD): No angiographic CAD.   Left circumflex (LCx): No angiographic CAD.   Right coronary artery (RCA): No angiographic CAD.   Left ventriculography: Left ventricular systolic function is normal, LVEF is estimated at 65-70% with no regional wall motion abnormalities, there is no significant mitral regurgitation   Final Conclusions:  No significant elevation in right or left heart filling pressures, no pulmonary  hypertension, normal cardiac output.  Normal coronary arteries.  She had significant relief of symptoms since I started Imdur 30 mg daily.  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  Continue Imdur.   [name] 05/17/2014, 12:31 PM

## 2014-05-17 NOTE — Interval H&P Note (Signed)
Cath Lab Visit (complete for each Cath Lab visit)  Clinical Evaluation Leading to the Procedure:   ACS: No.  Non-ACS:    Anginal Classification: CCS III  Anti-ischemic medical therapy: Minimal Therapy (1 class of medications)  Non-Invasive Test Results: Intermediate-risk stress test findings: cardiac mortality 1-3%/year  Prior CABG: No previous CABG      History and Physical Interval Note:  05/17/2014 11:43 AM  Brandy Hood  has presented today for surgery, with the diagnosis of heart failure  The various methods of treatment have been discussed with the patient and family. After consideration of risks, benefits and other options for treatment, the patient has consented to  Procedure(s): LEFT AND RIGHT HEART CATHETERIZATION WITH CORONARY ANGIOGRAM (N/A) as a surgical intervention .  The patient's history has been reviewed, patient examined, no change in status, stable for surgery.  I have reviewed the patient's chart and labs.  Questions were answered to the patient's satisfaction.     Brandy Hood [employer]

## 2014-05-17 NOTE — H&P (View-Only) (Signed)
Patient ID: Brandy Hood, female   DOB: 04/20/1961, 52 y.o.   MRN: [id number] PCP: Dr. Fulbright  52 yo with history of dyspnea and chest tightness presents for cardiology followup.  Patient reports 6 months of exertional dyspnea.  She has a history of asthma, but has not had wheezing.  She is short of breath after walking 50 feet and has to stop.  She will feel tightness in her chest along with the dyspnea.  She has slept on 3 pillows for years.  No PND. No palpitations.  No lightheadedness/syncope.  She had a chest CT that did not show interstitial lung disease.  She had a CPX that showed a moderate to severe functional limitation.  She has no prior history of cardiac disease.    After her last visit, I had her do an echocardiogram, which showed EF 55-60%.  She also had Lexiscan Cardiolite that was intermediate risk with a partially reversible inferolateral defect.  She continues to have chest tightness and dyspnea with walking 50 feet or so.   Labs (7/14): LDL 94 Labs (4/15): K 4.5, creatinine 0.8 Labs (8/15): TSH normal  PMH: 1. Hypothyroidism 2. Depression 3. GERD 4. Hyperlipidemia 5. Fibromyalgia 6. Obesity 7. Asthma 8. Lung nodules: CT chest 4/15 with RLL nodules, no interstitial lung disease.   9. Dyspnea: CT chest 4/15 without ILD.  CPX (6/15) with peak VO2 10.9, VE/VCO2 slope 37.8, inadequate HR response to exercise; suggested moderate functional limitation for age.  Echo (8/15) with EF 55-60%, no significant valvular abnormalities.  Lexiscan Cardiolite (8/15) with EF 68%, intermediate risk with a partially reversible inferolateral defect.   SH: Married, disabled, quit smoking years ago.    FH: Grandfather with MI, heart valve replacement.   ROS: All systems reviewed and negative except as per HPI.   Current Outpatient Prescriptions  Medication Sig Dispense Refill  . albuterol-ipratropium (COMBIVENT) 18-103 MCG/ACT inhaler Inhale 1 puff into the lungs every 6 (six) hours as  needed for wheezing or shortness of breath.  1 Inhaler  2  . benzonatate (TESSALON) 200 MG capsule Take 1 capsule (200 mg total) by mouth 3 (three) times daily as needed for cough.  30 capsule  1  . DULoxetine (CYMBALTA) 30 MG capsule Take 3 capsules daily  90 capsule  5  . ergocalciferol (DRISDOL) 50000 UNITS capsule Take 1 capsule po 3 times per week  12 capsule  12  . famotidine (PEPCID) 40 MG tablet Take 1 tablet (40 mg total) by mouth daily.  90 tablet  3  . HYDROcodone-homatropine (HYCODAN) 5-1.5 MG/5ML syrup Take 5 mLs by mouth every 6 (six) hours as needed for cough.  240 mL  0  . levothyroxine (SYNTHROID) 125 MCG tablet Take 1 tablet (125 mcg total) by mouth daily.  90 tablet  3  . lidocaine (LIDODERM) 5 % Place 3 patches onto the skin daily. Remove & Discard patch within 12 hours or as directed by MD  90 patch  5  . montelukast (SINGULAIR) 10 MG tablet Take 1 tablet (10 mg total) by mouth at bedtime.  90 tablet  3  . nortriptyline (PAMELOR) 25 MG capsule Take 1 capsule (25 mg total) by mouth at bedtime.  90 capsule  1  . pantoprazole (PROTONIX) 40 MG tablet Take 1 tablet (40 mg total) by mouth daily.  90 tablet  3  . simvastatin (ZOCOR) 40 MG tablet Take 1 tablet (40 mg total) by mouth every evening.  90 tablet  1  .   aspirin EC 81 MG tablet Take 1 tablet (81 mg total) by mouth daily.      . isosorbide mononitrate (IMDUR) 30 MG 24 hr tablet Take 1 tablet (30 mg total) by mouth daily.  30 tablet  6  . nitroGLYCERIN (NITROSTAT) 0.4 MG SL tablet Place 1 tablet (0.4 mg total) under the tongue every 5 (five) minutes as needed for chest pain.  25 tablet  5   No current facility-administered medications for this visit.   BP 125/86  Pulse 84  Ht  (1.575 m)  Wt 187 lb 6.4 oz (85.004 kg)  BMI 34.27 kg/m2 General: NAD, overweight Neck: No JVD, no thyromegaly or thyroid nodule.  Lungs: Clear to auscultation bilaterally with normal respiratory effort. CV: Nondisplaced PMI.  Heart regular  S1/S2, no S3/S4, no murmur.  No peripheral edema.  No carotid bruit.  Normal pedal pulses.  Abdomen: Soft, nontender, no hepatosplenomegaly, no distention.  Skin: Intact without lesions or rashes.  Neurologic: Alert and oriented x 3.  Psych: Normal affect. Extremities: No clubbing or cyanosis.  HEENT: Normal.   Assessment/Plan: 1. Exertional dyspnea + chest pain: Workup prior to seeing me included a CT chest that did not show interstitial lung disease and a CPX that showed a rather significant functional deficit.  Echo showed normal EF.  Lexiscan Cardiolite was intermediate risk with a reversible inferolateral defect, suggesting that her exertional symptoms could be due to ischemia.   - I am going to set her up for LHC/RHC to assess for coronary disease and also to assess filling pressures and look for pulmonary hypertension.   - She will start ASA 81 mg daily and I will also have her start Imdur 30 mg daily to help with the exertional symptoms.  She will have sublingual NTG available.  - I will check lipids to make sure that LDL is under control on simvastatin.  2. Chronotropic incompetence: This was suggested on recent CPX.  However, when I walked her in the office at last appointment, HR rose from 84 to 98.    Brandy Hood 05/13/2014

## 2014-05-30 ENCOUNTER — Ambulatory Visit (INDEPENDENT_AMBULATORY_CARE_PROVIDER_SITE_OTHER): Payer: Medicare Other | Admitting: Physician Assistant

## 2014-05-30 ENCOUNTER — Encounter: Payer: Self-pay | Admitting: Physician Assistant

## 2014-05-30 VITALS — BP 139/89 | HR 78 | Ht 62.0 in | Wt 187.0 lb

## 2014-05-30 DIAGNOSIS — R030 Elevated blood-pressure reading, without diagnosis of hypertension: Secondary | ICD-10-CM

## 2014-05-30 DIAGNOSIS — M797 Fibromyalgia: Secondary | ICD-10-CM

## 2014-05-30 DIAGNOSIS — I2089 Other forms of angina pectoris: Secondary | ICD-10-CM

## 2014-05-30 DIAGNOSIS — E785 Hyperlipidemia, unspecified: Secondary | ICD-10-CM

## 2014-05-30 DIAGNOSIS — IMO0001 Reserved for inherently not codable concepts without codable children: Secondary | ICD-10-CM

## 2014-05-30 DIAGNOSIS — I208 Other forms of angina pectoris: Secondary | ICD-10-CM | POA: Insufficient documentation

## 2014-05-30 DIAGNOSIS — I209 Angina pectoris, unspecified: Secondary | ICD-10-CM

## 2014-05-30 NOTE — Patient Instructions (Signed)
Your physician recommends that you continue on your current medications as directed. Please refer to the Current Medication list given to you today.   Your physician recommends that you schedule a follow-up appointment in:  DR [facility] IN 2 MONTHS    PLEASE MONITOR BLOOD PRESSURE AT HOME CALL IF BLOOD PRESSURE IS 140/90 OR HIGHER

## 2014-05-30 NOTE — Progress Notes (Signed)
[address] [place], [place]  [address] Phone: [phone] Fax:  [phone]  Date:  05/30/2014   Patient ID:  Brandy Hood, Brandy Hood Jan 19, 1961, MRN [id number]   PCP:  Birdena Jubilee, MD  Cardiologist:  Dr. Shirlee Latch  History of Present Illness: Brandy Hood is a 53 y.o. 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  She had recently seen Dr. Shirlee Latch for exertional dyspnea. CT chest 12/2013 showed nodular opacities, consolidation of posterior left base. F/u CT 03/2014 showed interval resolution of patchy peribronchial nodular densities at both lung bases consistent with resolving inflammation; new volume loss and linear opacity in the right middle lobe most consistent with atelectasis; no confluent airspace opacity or endobronchial lesion identified; radiographic follow up suggested (pulm plans for repeat CT in 1 year). She had a CPX that showed a moderate to severe functional limitation. Dr. Alford Highland last OV says chronotropic incompetence was suggested on recent CPX, however, when he walked her in the office at previous appointment, HR rose from 84 to 98. 2D echo 05/07/14: unremarkable, EF 55-60%, normal wall thickness. She underwent nuclear stress testing showing EF 68%, intermediate risk with a partially reversible inferolateral defect. Dr. Shirlee Latch recommended cath and started her on aspirin  and Imdur . She underwent R/LHC on 05/17/14 showing no significant elevation in right or left heart filling pressures, no pulmonary hypertension, normal cardiac output, and normal coronary arteries. Given relief of sx since starting Imdur, Dr. 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  She presents for post-cath check-up. She remains free of chest pain. SOB has improved as well - still comes and goes but is much better than it was. No problems with cath site.  Recent Labs: 8/31 - normal free T4/T3,05/07/2014: TSH  0.04*  05/11/2014: Creatinine 0.9; HDL Cholesterol by NMR 68.40; Hemoglobin 12.2; LDL (calc) 77; Potassium 3.9   Wt Readings from Last 3 Encounters:  05/30/14 187 lb (84.823 kg)  05/17/14 187 lb (84.823 kg)  05/17/14 187 lb (84.823 kg)     Past Medical History  Diagnosis Date  . Fibromyalgia   . Hypothyroidism   . GERD (gastroesophageal reflux disease)   . Hyperlipidemia   . Anxiety   . Asthma     Seasonal  . Obesity     Maximun Weight 368   . COPD (chronic obstructive pulmonary disease)   . Depression   . Lung nodules     a. CT chest 4/15 with RLL nodules, no interstitial lung disease. b. f/u CT 03/2014: interval resolution of patchy peribronchial nodular densities at both lung bases consistent with resolving inflammation; new volume loss/linear opacity RML c/w atx. Radiographic f/u suggested.  Marland Kitchen Dyspnea     a. CT chest 4/15 without ILD; CT 03/2014 ? RML atx.  CPX (6/15) with peak VO2 10.9, VE/VCO2 slope 37.8, inadequate HR response to exercise; suggested moderate functional limitation for age.  Echo (8/15) with EF 55-60%, no significant valvular abnormalities. Lexiscan nuc 04/2014 abnormal, EF 68% -> cath 9/15 with normal cors, EF 65-70%, no RWMA, no MR. 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    Current Outpatient Prescriptions  Medication Sig Dispense Refill  . albuterol-ipratropium (COMBIVENT) 18-103 MCG/ACT inhaler Inhale 1 puff into the lungs every 6 (six) hours as needed for wheezing or shortness of breath.  1 Inhaler  2  . aspirin EC 81 MG tablet Take 1 tablet (81 mg total) by mouth daily.      . benzonatate (TESSALON) 200 MG  capsule Take 1 capsule (200 mg total) by mouth 3 (three) times daily as needed for cough.  30 capsule  1  . DULoxetine (CYMBALTA) 30 MG capsule Take 90 mg by mouth daily.      . famotidine (PEPCID) 40 MG tablet Take 1 tablet (40 mg total) by mouth daily.  90 tablet  3  . HYDROcodone-homatropine (HYCODAN) 5-1.5 MG/5ML syrup Take 5 mLs by mouth every 6 (six) hours  as needed for cough.  240 mL  0  . isosorbide mononitrate (IMDUR) 30 MG 24 hr tablet Take 1 tablet (30 mg total) by mouth daily.  30 tablet  6  . levothyroxine (SYNTHROID) 125 MCG tablet Take 1 tablet (125 mcg total) by mouth daily.  90 tablet  3  . lidocaine (LIDODERM) 5 % Place 1 patch onto the skin daily as needed (pain). Remove & Discard patch within 12 hours or as directed by MD      . montelukast (SINGULAIR) 10 MG tablet Take 1 tablet (10 mg total) by mouth at bedtime.  90 tablet  3  . nitroGLYCERIN (NITROSTAT) 0.4 MG SL tablet Place 1 tablet (0.4 mg total) under the tongue every 5 (five) minutes as needed for chest pain.  25 tablet  5  . nortriptyline (PAMELOR) 25 MG capsule Take 1 capsule (25 mg total) by mouth at bedtime.  90 capsule  1  . pantoprazole (PROTONIX) 40 MG tablet Take 1 tablet (40 mg total) by mouth daily.  90 tablet  3  . simvastatin (ZOCOR) 40 MG tablet Take 20 mg by mouth daily.      . Vitamin D, Ergocalciferol, (DRISDOL) 50000 UNITS CAPS capsule Take 50,000 Units by mouth 3 (three) times a week. Monday,wednesday & fridays       No current facility-administered medications for this visit.    Allergies:   Wellbutrin   Social History:  The patient  reports that she quit smoking about 23 years ago. Her smoking use included Cigarettes. She has a 35 pack-year smoking history. She has never used smokeless tobacco. She reports that she does not drink alcohol or use illicit drugs.   Family History:  The patient's family history includes AAA (abdominal aortic aneurysm) in her father; Asthma in her father, mother, and sister; COPD in her father, mother, and sister; Diabetes in her mother; Hyperlipidemia in her father and mother; Hypertension in her father; Lung cancer in her father.   ROS:  Please see the history of present illness.    All other systems reviewed and negative.   PHYSICAL EXAM:  VS:  BP 139/89  Pulse 78  Ht  (1.575 m)  Wt 187 lb (84.823 kg)  BMI 34.19  kg/m2. Recheck BP 152/85 Well nourished, well developed WF in no acute distress HEENT: normal Neck: no JVD Cardiac:  normal S1, S2; RRR; no murmur Lungs:  clear to auscultation bilaterally, no wheezing, rhonchi or rales Abd: soft, nontender Ext: no edema, R groin cath site without any ecchymosis, hematoma or bruit Skin: warm and dry Neuro:  moves all extremities spontaneously, no focal abnormalities noted  EKG:  NSR 73bpm, no acute ST-T changes    ASSESSMENT AND PLAN:  1. Microvascular angina - improved. Continue aspirin, Imdur. 2. Hyperlipidemia - controlled by recent labs 05/11/14. 3. Elevated blood pressure without history of hypertension - prior BP's have been WNL. Have asked her to monitor BP outside of the clinic and call if tending to run over 140/90, at which time we might  consider adding Norvasc. 4. Fibromyalgia - this limits her activity but we discussed gradual increases in her walking program.  Dispo: f/u Dr. Shirlee Latch in 2 months.  Signed, Ronie Spies, PA-C  05/30/2014 10:56 AM

## 2014-06-02 ENCOUNTER — Telehealth: Payer: Self-pay | Admitting: Cardiology

## 2014-06-02 NOTE — Telephone Encounter (Signed)
Pt was instructed to call if BP > 140/90 last night and today it was 141/90 and 135/90 so pt concerned, I asked her to continue to check and if ran greater than the 140/90 for a week then to please let us know, for 2 readings I did not feel comfortable adding meds unless this was the trend.  Pt will call if BP continues elevated.  In the office 139/89.

## 2014-06-03 NOTE — Telephone Encounter (Signed)
Agree. Thanks! Dayna Dunn PA-C  

## 2014-06-21 ENCOUNTER — Telehealth: Payer: Self-pay | Admitting: Cardiology

## 2014-06-21 NOTE — Telephone Encounter (Signed)
Pt states another  BP reading today 138/98. Pt states she initially checked her BP because she had a headache but denies any other symptoms.   Pt states she has been checking BP regularly the last couple of weeks and it has been averaging 130s/90s. Pt does not know her heart rate.   I will forward to Dr Shirlee LatchMcLean for review.

## 2014-06-21 NOTE — Telephone Encounter (Signed)
New message  Pt called to give her readings as instructed--- 141/98 @ 11am. Please call back to disucss

## 2014-06-21 NOTE — Telephone Encounter (Signed)
Add amlodipine 5 mg daily, have her call in BP readings in 2 wks.

## 2014-07-02 NOTE — Telephone Encounter (Signed)
LMTCB

## 2014-07-02 NOTE — Telephone Encounter (Signed)
Pt advised,verbalized understanding. 

## 2014-07-02 NOTE — Telephone Encounter (Signed)
Follow up ° ° ° ° °Returned a nurses call °

## 2014-07-02 NOTE — Telephone Encounter (Signed)
Pt confirmed she is taking simvastatin 20mg  daily and not simvastatin 40mg  daily. Per Dr Wilmon PaliMcLean--OK to take amlodipine 5mg  daily with simvastatin 20mg  daily

## 2014-07-03 ENCOUNTER — Other Ambulatory Visit: Payer: Self-pay

## 2014-07-03 MED ORDER — AMLODIPINE BESYLATE 5 MG PO TABS: 5.0000 mg | ORAL_TABLET | Freq: Every day | ORAL | Status: AC

## 2014-08-10 ENCOUNTER — Encounter: Payer: Self-pay | Admitting: Physician Assistant

## 2014-08-10 ENCOUNTER — Ambulatory Visit (INDEPENDENT_AMBULATORY_CARE_PROVIDER_SITE_OTHER): Payer: Medicare Other | Admitting: Physician Assistant

## 2014-08-10 VITALS — BP 134/82 | HR 78 | Ht 65.0 in | Wt 183.8 lb

## 2014-08-10 DIAGNOSIS — E785 Hyperlipidemia, unspecified: Secondary | ICD-10-CM

## 2014-08-10 DIAGNOSIS — I10 Essential (primary) hypertension: Secondary | ICD-10-CM

## 2014-08-10 DIAGNOSIS — I208 Other forms of angina pectoris: Secondary | ICD-10-CM

## 2014-08-10 MED ORDER — ISOSORBIDE MONONITRATE ER 30 MG PO TB24: 30.0000 mg | ORAL_TABLET | ORAL | Status: DC

## 2014-08-10 NOTE — Patient Instructions (Signed)
CHANGE TAKING IMDUR TO EVERY MORNING  Your physician wants you to follow-up in: 4 MONTHS WITH DR. Shirlee LatchMCLEAN You will receive a reminder letter in the mail two months in advance. If you don't receive a letter, please call our office to schedule the follow-up appointment.   Your physician recommends that you continue on your current medications as directed. Please refer to the Current Medication list given to you today.

## 2014-08-10 NOTE — Progress Notes (Signed)
Cardiology Office Note   Date:  08/10/2014   ID:  Brandy SickleMartha Hood, DOB 01/31/1961, MRN [id number]  PCP:  Birdena JubileeZANARD, ROBYN, MD  Cardiologist:  Dr. Marca Anconaalton McLean     History of Present Illness: Brandy SickleMartha Hood is a 53 y.o. female with a history of hypothyroidism, obesity, asthma. She was recently evaluated for exertional dyspnea. Chest CT in 12/2013 demonstrated nodular opacities, consolidation of posterior left base. Follow-up CT in 7/15 demonstrated interval resolution of patchy peribronchial nodular densities at both lung bases consistent with resolving inflammation. There was new volume loss and linear opacity of the right middle lobe most consistent with atelectasis. She is followed by pulmonology and the plan is to repeat a CT in one year. CPX testing demonstrated moderate to severe functional limitation. Echocardiogram has demonstrated normal LV function. Nuclear stress testing was intermediate risk with partially reversible inferolateral defect. She ultimately underwent cardiac catheterization by Dr. Shirlee LatchMcLean 9/15. This demonstrated no significant elevation in right and left heart pressures. There was no pulmonary hypertension and there were normal coronary arteries. She was suspected to have microvascular angina. She had improved symptoms on long-acting nitrates. She was last seen in this office 05/2014 by Brandy Spiesayna Dunn, PA-C.  Since that time, amlodipine has been added for elevated blood pressure. She returns for follow-up.  She is doing well without significant chest pain. She does have chronic dyspnea that she relates to asthma. She is NYHA 2-2b. She denies syncope, near syncope, orthopnea, PND or edema. She denies headaches or visual disturbances. Blood pressures at home range 130/85.    Recent Labs: 05/07/2014: TSH 0.04* 05/11/2014: BUN 8; Creatinine 0.9; Hemoglobin 12.2; LDL (calc) 77; Potassium 3.9; Sodium 140    Recent Radiology: No results found.    Wt Readings from Last 3 Encounters:    08/10/14 183 lb 12.8 oz (83.371 kg)  05/30/14 187 lb (84.823 kg)  05/17/14 187 lb (84.823 kg)     Past Medical History  Diagnosis Date  . Fibromyalgia   . Hypothyroidism   . GERD (gastroesophageal reflux disease)   . Hyperlipidemia   . Anxiety   . Asthma     Seasonal  . Obesity     Maximun Weight 368   . COPD (chronic obstructive pulmonary disease)   . Depression   . Lung nodules     a. CT chest 4/15 with RLL nodules, no interstitial lung disease. b. f/u CT 03/2014: interval resolution of patchy peribronchial nodular densities at both lung bases consistent with resolving inflammation; new volume loss/linear opacity RML c/w atx. Radiographic f/u suggested.  Marland Kitchen. Dyspnea     a. CT chest 4/15 without ILD; CT 03/2014 ? RML atx.  CPX (6/15) with peak VO2 10.9, VE/VCO2 slope 37.8, inadequate HR response to exercise; suggested moderate functional limitation for age.  Echo (8/15) with EF 55-60%, no significant valvular abnormalities. Lexiscan nuc 04/2014 abnormal, EF 68% -> cath 9/15 with normal cors, EF 65-70%, no RWMA, no MR. 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    Current Outpatient Prescriptions  Medication Sig Dispense Refill  . albuterol-ipratropium (COMBIVENT) 18-103 MCG/ACT inhaler Inhale 1 puff into the lungs every 6 (six) hours as needed for wheezing or shortness of breath. 1 Inhaler 2  . amLODipine (NORVASC) 5 MG tablet Take 1 tablet (5 mg total) by mouth daily. 180 tablet 3  . aspirin EC 81 MG tablet Take 1 tablet (81 mg total) by mouth daily.    . benzonatate (TESSALON) 200 MG capsule Take 1 capsule (200 mg total) by  mouth 3 (three) times daily as needed for cough. 30 capsule 1  . DULoxetine (CYMBALTA) 30 MG capsule Take 90 mg by mouth daily.    . famotidine (PEPCID) 40 MG tablet Take 1 tablet (40 mg total) by mouth daily. 90 tablet 3  . HYDROcodone-homatropine (HYCODAN) 5-1.5 MG/5ML syrup Take 5 mLs by mouth every 6 (six) hours as needed for cough. 240 mL 0  . isosorbide mononitrate  (IMDUR) 30 MG 24 hr tablet Take 1 tablet (30 mg total) by mouth every morning.    Marland Kitchen. levothyroxine (SYNTHROID) 125 MCG tablet Take 1 tablet (125 mcg total) by mouth daily. 90 tablet 3  . lidocaine (LIDODERM) 5 % Place 1 patch onto the skin daily as needed (pain). Remove & Discard patch within 12 hours or as directed by MD    . montelukast (SINGULAIR) 10 MG tablet Take 1 tablet (10 mg total) by mouth at bedtime. 90 tablet 3  . nitroGLYCERIN (NITROSTAT) 0.4 MG SL tablet Place 1 tablet (0.4 mg total) under the tongue every 5 (five) minutes as needed for chest pain. 25 tablet 5  . nortriptyline (PAMELOR) 25 MG capsule Take 1 capsule (25 mg total) by mouth at bedtime. 90 capsule 1  . pantoprazole (PROTONIX) 40 MG tablet Take 1 tablet (40 mg total) by mouth daily. 90 tablet 3  . simvastatin (ZOCOR) 20 MG tablet Take 1 tablet (20 mg total) by mouth at bedtime.    . Vitamin D, Ergocalciferol, (DRISDOL) 50000 UNITS CAPS capsule Take 50,000 Units by mouth 3 (three) times a week. Monday,wednesday & fridays     No current facility-administered medications for this visit.     Allergies:   Wellbutrin   Social History:  The patient  reports that she quit smoking about 23 years ago. Her smoking use included Cigarettes. She has a 35 pack-year smoking history. She has never used smokeless tobacco. She reports that she does not drink alcohol or use illicit drugs.   Family History:  The patient's family history includes AAA (abdominal aortic aneurysm) in her father; Asthma in her father, mother, and sister; COPD in her father, mother, and sister; Diabetes in her mother; Hyperlipidemia in her father and mother; Hypertension in her father; Lung cancer in her father.    ROS:  Please see the history of present illness.       All other systems reviewed and negative.    PHYSICAL EXAM: VS:  BP 134/82 mmHg  Pulse 78  Ht 5\' 5"  (1.651 m)  Wt 183 lb 12.8 oz (83.371 kg)  BMI 30.59 kg/m2 Well nourished, well developed,  in no acute distress HEENT: normal Neck: no  JVD Cardiac:  normal S1, S2;  RRR; no murmur   Lungs:   clear to auscultation bilaterally, no wheezing, rhonchi or rales Abd: soft, nontender, no hepatomegaly Ext: no edema Skin: warm and dry Neuro:  CNs 2-12 intact, no focal abnormalities noted  EKG:  NSR, HR 78, normal axis, no ST changes      ASSESSMENT AND PLAN:  1. Essential hypertension:  Blood pressure controlled. She does seem to have some elevations in the mornings. I have recommended that she change her isosorbide to every morning. 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 3.  Hyperlipidemia:  Continue statin. FU by PCP.  Disposition:   FU with Dr. Marca Anconaalton McLean 4 mos.    Signed, Brandy RimScott Render Marley, PA-C, MHS 08/10/2014 11:27 PM    [facility] [address], [place], [place]  [address] Phone: [phone]; Fax: [phone]

## 2014-08-16 ENCOUNTER — Encounter (HOSPITAL_COMMUNITY): Payer: Self-pay | Admitting: Cardiology

## 2014-12-03 ENCOUNTER — Other Ambulatory Visit: Payer: Self-pay | Admitting: Cardiology

## 2014-12-03 DIAGNOSIS — I208 Other forms of angina pectoris: Secondary | ICD-10-CM

## 2014-12-03 MED ORDER — ISOSORBIDE MONONITRATE ER 30 MG PO TB24: 30.0000 mg | ORAL_TABLET | ORAL | Status: AC

## 2014-12-03 NOTE — Telephone Encounter (Signed)
Beatrice LecherScott T Weaver, PA-C at 08/10/2014 11:26 PM  isosorbide mononitrate (IMDUR) 30 MG 24 hr tablet Take 1 tablet (30 mg total) by mouth every morning ASSESSMENT AND PLAN:  1. Essential hypertension: Blood pressure controlled. She does seem to have some elevations in the mornings. I have recommended that she change her isosorbide to every morning

## 2014-12-17 ENCOUNTER — Other Ambulatory Visit: Payer: Self-pay | Admitting: Cardiology

## 2015-03-12 ENCOUNTER — Ambulatory Visit (INDEPENDENT_AMBULATORY_CARE_PROVIDER_SITE_OTHER)
Admission: RE | Admit: 2015-03-12 | Discharge: 2015-03-12 | Disposition: A | Discharge status: Home / Self Care | Payer: Medicare Other | Source: Ambulatory Visit | Attending: Internal Medicine | Admitting: Internal Medicine

## 2015-03-12 DIAGNOSIS — R918 Other nonspecific abnormal finding of lung field: Secondary | ICD-10-CM

## 2015-03-15 ENCOUNTER — Telehealth: Payer: Self-pay | Admitting: Internal Medicine

## 2015-03-15 NOTE — Telephone Encounter (Signed)
Called and spoke to pt. Informed her of the CT chest results. Pt verbalized understanding and denied any further questions or concerns at this time.

## 2015-03-15 NOTE — Telephone Encounter (Signed)
  CT chest  03/12/15  - see bold. Please give her the results  Dr. Kalman ShanMurali Keyuana Wank, M.D., [facility]C.P Pulmonary and Critical Care Medicine Staff Physician [facility] Pager: [phone], If no answer or between  15:00h - 7:00h: call [phone]  03/15/2015 9:27 AM    s     COMPARISON: Chest CT 03/07/2014.  FINDINGS: Mediastinum/Lymph Nodes: Heart size is normal. There is no significant pericardial fluid, thickening or pericardial calcification. No pathologically enlarged mediastinal or hilar lymph nodes. Please note that accurate exclusion of hilar adenopathy is limited on noncontrast CT scans. Esophagus is unremarkable in appearance. No axillary lymphadenopathy.  Lungs/Pleura: No suspicious appearing pulmonary nodules or masses are noted. Small area of chronic volume loss with some associated cylindrical bronchiectasis in the inferior segment of the lingula is unchanged, most compatible with chronic post infectious or inflammatory scarring. Resolution of previously noted atelectasis in the right middle lobe compared to the prior study. No acute consolidative airspace disease. No pleural effusions.  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  Musculoskeletal/Soft Tissues: There are no aggressive appearing lytic or blastic lesions noted in the visualized portions of the skeleton.  IMPRESSION: 1. No suspicious pulmonary nodules or masses are noted. 2. Resolution of previously noted right middle lobe subsegmental atelectasis. 3. Mild post infectious or inflammatory scarring in the inferior segment of the lingula is unchanged.   Electronically Signed  By: Trudie Reedaniel Entrikin M.D.  On: 03/12/2015 09:48

## 2016-05-09 IMAGING — CT CT CHEST W/O CM
2 of 4 series · 15 of 36 positions shown, 18 images · IV contrast (Omnipaque 300)
Comparison: Chest CT 03/07/2014.

CLINICAL DATA: 53-year-old female with history of pulmonary
nodules. Followup study.

EXAM:
CT CHEST WITHOUT CONTRAST
TECHNIQUE: Multidetector CT imaging of the chest was performed following the
standard protocol without IV contrast.

[Series 2: chest routine with · axial · 0.68mm/px · z∈[-280,-40]mm · 12 of 57 slices shown, 15 images]
[im 5/57  mediastinal]
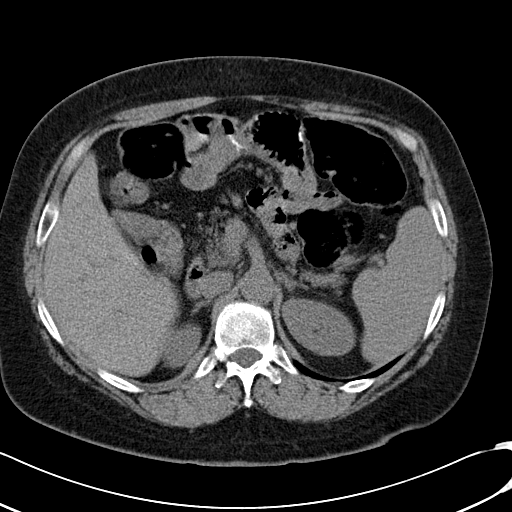
[im 5/57  lung]
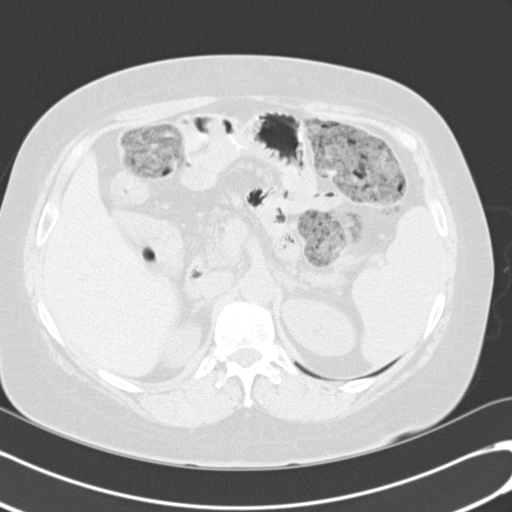
[im 9/57  lung]
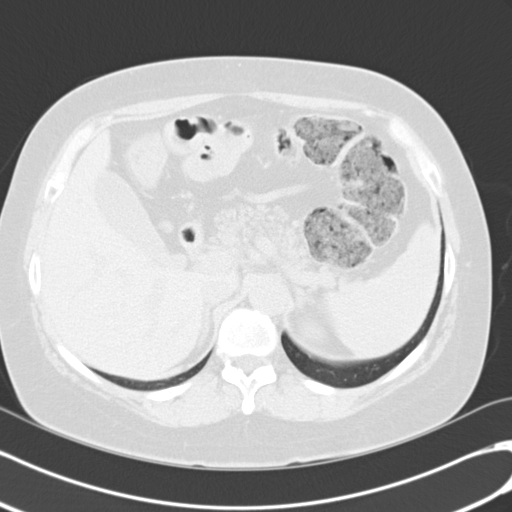
[im 13/57  lung]
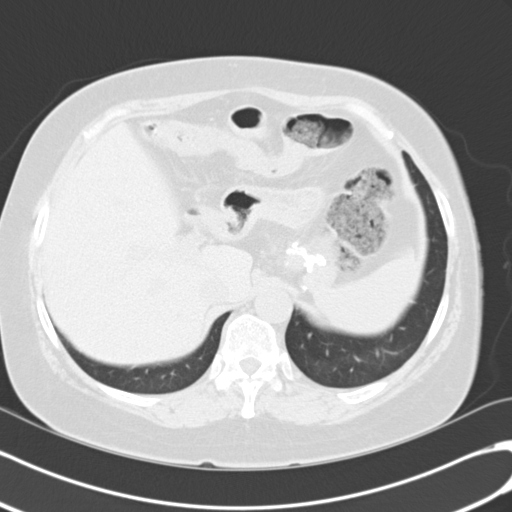
[im 17/57  lung]
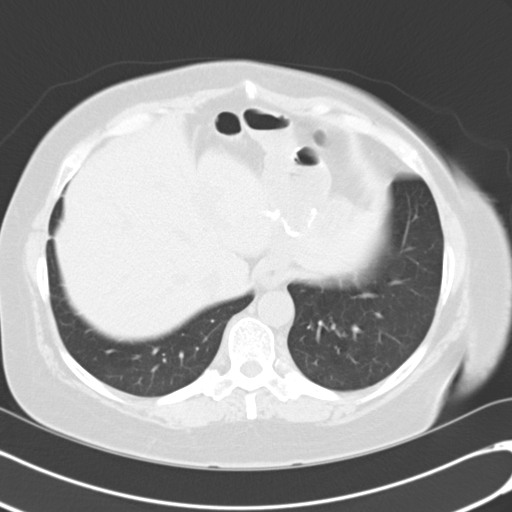
[im 21/57  mediastinal]
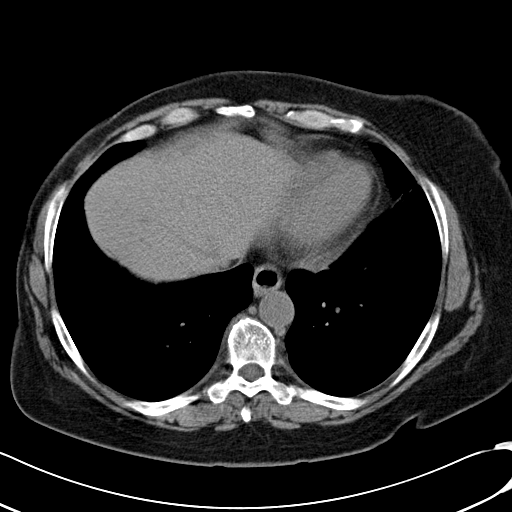
[im 21/57  lung]
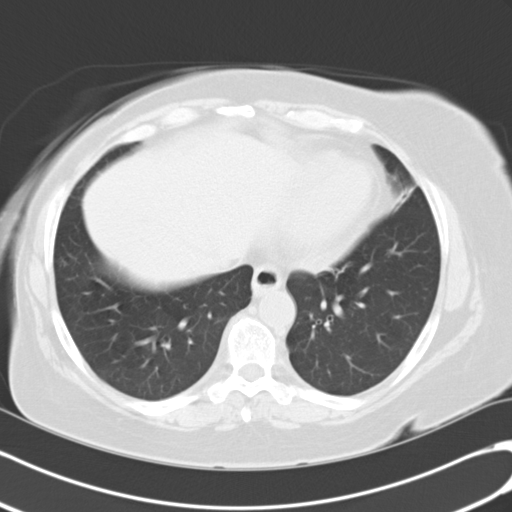
[im 25/57  lung]
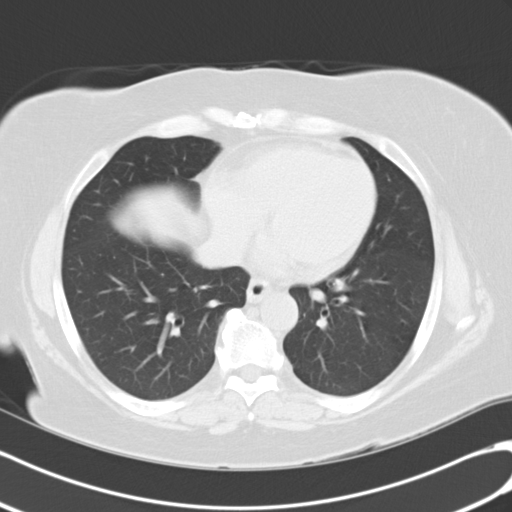
[im 33/57  lung]
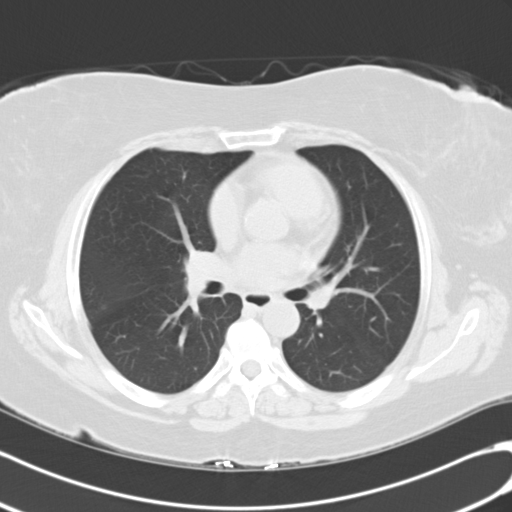
[im 37/57  lung]
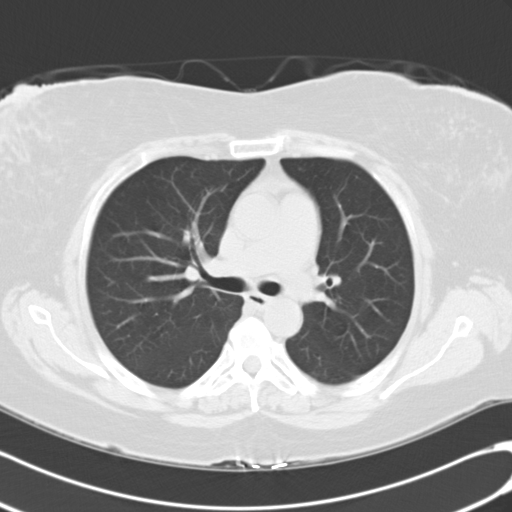
[im 41/57  mediastinal]
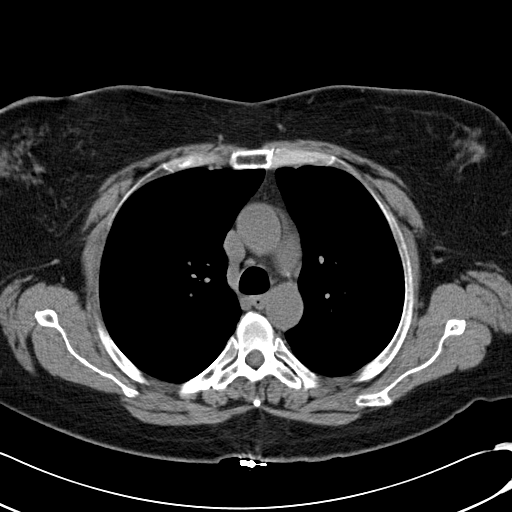
[im 41/57  lung]
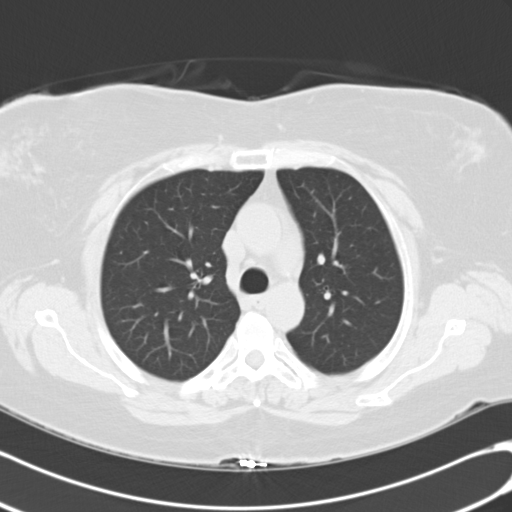
[im 45/57  lung]
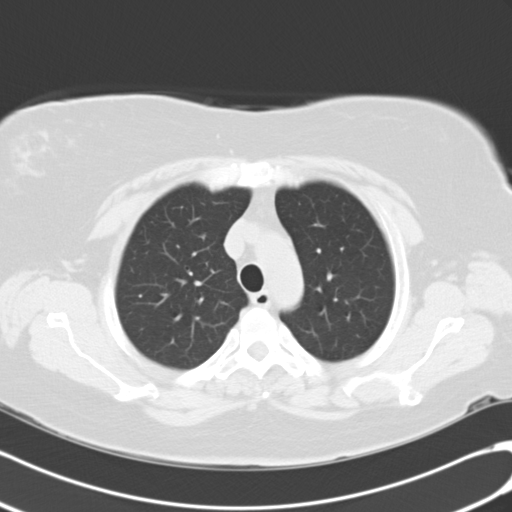
[im 49/57  lung]
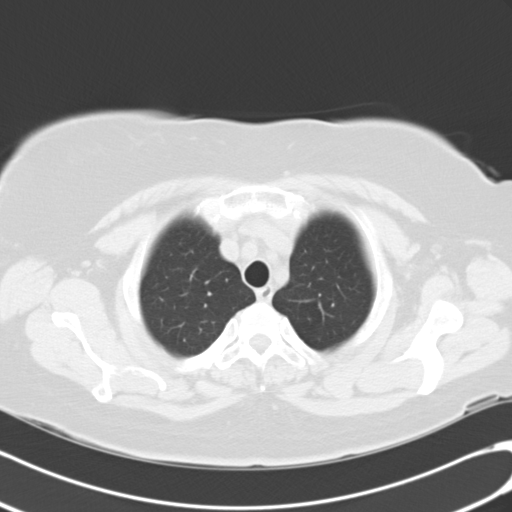
[im 53/57  lung]
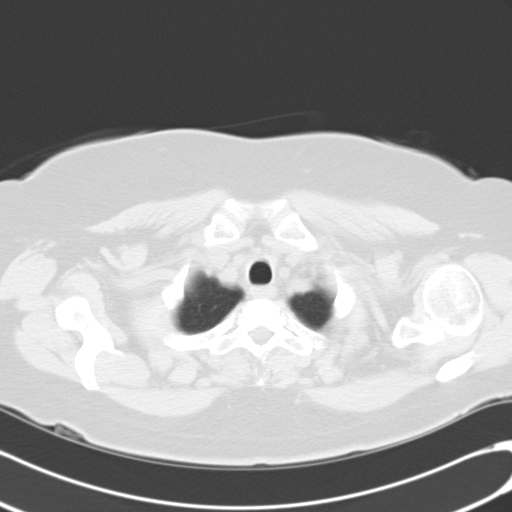

[Series 604: cor · coronal · 0.68mm/px · 3 of 113 slices shown]
[im 23/113  lung]
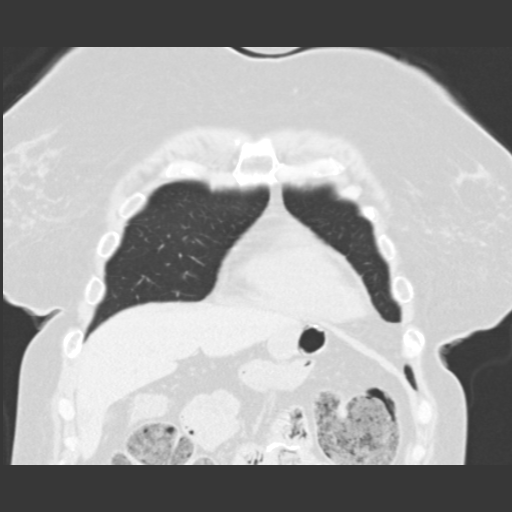
[im 45/113  lung]
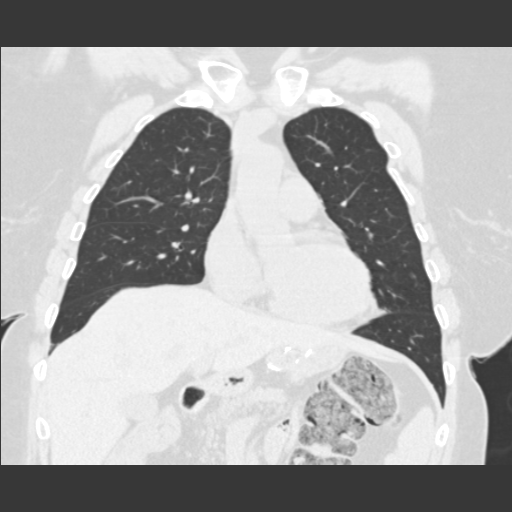
[im 68/113  lung]
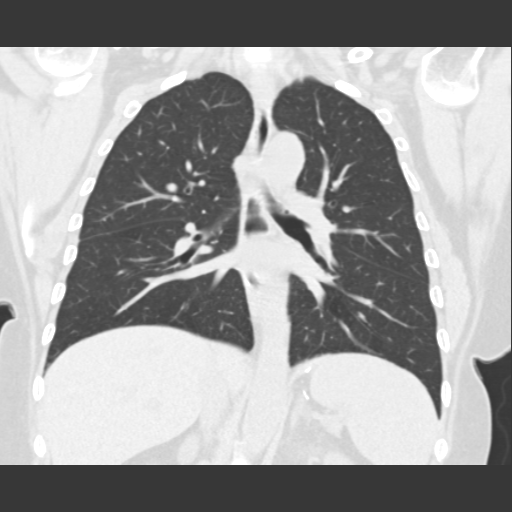

[15 of 36 positions shown; findings below may reference images not displayed]

FINDINGS: Mediastinum/Lymph Nodes: Heart size is normal. There is no
significant pericardial fluid, thickening or pericardial
calcification. No pathologically enlarged mediastinal or hilar lymph
nodes. Please note that accurate exclusion of hilar adenopathy is
limited on noncontrast CT scans. Esophagus is unremarkable in
appearance. No axillary lymphadenopathy.

Lungs/Pleura: No suspicious appearing pulmonary nodules or masses
are noted. Small area of chronic volume loss with some associated
cylindrical bronchiectasis in the inferior segment of the lingula is
unchanged, most compatible with chronic post infectious or
inflammatory scarring. Resolution of previously noted atelectasis in
the right middle lobe compared to the prior study. No acute
consolidative airspace disease. No pleural effusions.

Upper Abdomen: Postoperative changes in the stomach suggestive of
prior gastric bypass procedure.

Musculoskeletal/Soft Tissues: There are no aggressive appearing
lytic or blastic lesions noted in the visualized portions of the
skeleton.
IMPRESSION: 1. No suspicious pulmonary nodules or masses are noted.
2. Resolution of previously noted right middle lobe subsegmental
atelectasis.
3. Mild post infectious or inflammatory scarring in the inferior
segment of the lingula is unchanged.
